# Patient Record
Sex: Male | Born: 1940 | Race: White | Hispanic: No | Marital: Married
Health system: Southern US, Community
[De-identification: ages and names within clinical notes are randomized; demographics above are authoritative.]

---

## 2016-09-21 ENCOUNTER — Ambulatory Visit: Payer: Medicare Other | Admitting: Speech Pathology

## 2016-09-26 ENCOUNTER — Encounter: Payer: Medicare Other | Admitting: Speech Pathology

## 2016-09-28 ENCOUNTER — Encounter: Payer: Medicare Other | Admitting: Speech Pathology

## 2016-10-01 ENCOUNTER — Encounter: Payer: Medicare Other | Admitting: Speech Pathology

## 2016-10-03 ENCOUNTER — Encounter: Payer: Medicare Other | Admitting: Speech Pathology

## 2016-10-08 ENCOUNTER — Encounter: Payer: Medicare Other | Admitting: Speech Pathology

## 2016-10-10 ENCOUNTER — Encounter: Payer: Medicare Other | Admitting: Speech Pathology

## 2016-10-16 ENCOUNTER — Encounter: Payer: Medicare Other | Admitting: Speech Pathology

## 2016-10-22 ENCOUNTER — Encounter: Payer: Medicare Other | Admitting: Speech Pathology

## 2016-10-24 ENCOUNTER — Encounter: Payer: Medicare Other | Admitting: Speech Pathology

## 2016-10-29 ENCOUNTER — Encounter: Payer: Medicare Other | Admitting: Speech Pathology

## 2016-10-31 ENCOUNTER — Encounter: Payer: Medicare Other | Admitting: Speech Pathology

## 2016-11-05 ENCOUNTER — Encounter: Payer: Medicare Other | Admitting: Speech Pathology

## 2016-11-07 ENCOUNTER — Encounter: Payer: Medicare Other | Admitting: Speech Pathology

## 2020-06-20 ENCOUNTER — Other Ambulatory Visit: Payer: Self-pay

## 2020-06-20 ENCOUNTER — Ambulatory Visit: Payer: Medicare PPO | Admitting: Dermatology

## 2020-06-20 DIAGNOSIS — Z1283 Encounter for screening for malignant neoplasm of skin: Secondary | ICD-10-CM | POA: Diagnosis not present

## 2020-06-20 DIAGNOSIS — Z872 Personal history of diseases of the skin and subcutaneous tissue: Secondary | ICD-10-CM

## 2020-06-20 DIAGNOSIS — L219 Seborrheic dermatitis, unspecified: Secondary | ICD-10-CM | POA: Diagnosis not present

## 2020-06-20 DIAGNOSIS — L821 Other seborrheic keratosis: Secondary | ICD-10-CM

## 2020-06-20 DIAGNOSIS — S30861A Insect bite (nonvenomous) of abdominal wall, initial encounter: Secondary | ICD-10-CM

## 2020-06-20 DIAGNOSIS — D1800 Hemangioma unspecified site: Secondary | ICD-10-CM

## 2020-06-20 DIAGNOSIS — L304 Erythema intertrigo: Secondary | ICD-10-CM

## 2020-06-20 DIAGNOSIS — L820 Inflamed seborrheic keratosis: Secondary | ICD-10-CM | POA: Diagnosis not present

## 2020-06-20 DIAGNOSIS — L814 Other melanin hyperpigmentation: Secondary | ICD-10-CM

## 2020-06-20 DIAGNOSIS — W57XXXA Bitten or stung by nonvenomous insect and other nonvenomous arthropods, initial encounter: Secondary | ICD-10-CM

## 2020-06-20 DIAGNOSIS — D229 Melanocytic nevi, unspecified: Secondary | ICD-10-CM

## 2020-06-20 DIAGNOSIS — L578 Other skin changes due to chronic exposure to nonionizing radiation: Secondary | ICD-10-CM

## 2020-06-20 MED ORDER — MOMETASONE FUROATE 0.1 % EX OINT: TOPICAL_OINTMENT | CUTANEOUS | 0 refills | Status: AC

## 2020-06-20 NOTE — Progress Notes (Signed)
   Follow-Up Visit   Subjective  Donald Fowler is a 79 y.o. male who presents for the following: Annual Exam (Total body skin exam, hx AKs) and hx tick bite (R abdomen, ~24m, no symptoms). The patient presents for Total-Body Skin Exam (TBSE) for skin cancer screening and mole check.  The following portions of the chart were reviewed this encounter and updated as appropriate:  Allergies  Meds  Problems  Med Hx  Surg Hx  Fam Hx     Review of Systems:  No other skin or systemic complaints except as noted in HPI or Assessment and Plan.  Objective  Well appearing patient in no apparent distress; mood and affect are within normal limits.  A full examination was performed including scalp, head, eyes, ears, nose, lips, neck, chest, axillae, abdomen, back, buttocks, bilateral upper extremities, bilateral lower extremities, hands, feet, fingers, toes, fingernails, and toenails. All findings within normal limits unless otherwise noted below.  Objective  bil ears: Pink patches with greasy scale.   Objective  Face, scalp: Face/scalp clear today  Objective  bil shoulders: Erythematous keratotic or waxy stuck-on papule or plaque.    Assessment & Plan    Lentigines - Scattered tan macules - Discussed due to sun exposure - Benign, observe - Call for any changes  Seborrheic Keratoses - Stuck-on, waxy, tan-brown papules and plaques  - Discussed benign etiology and prognosis. - Observe - Call for any changes  Melanocytic Nevi - Tan-brown and/or pink-flesh-colored symmetric macules and papules - Benign appearing on exam today - Observation - Call clinic for new or changing moles - Recommend daily use of broad spectrum spf 30+ sunscreen to sun-exposed areas.   Hemangiomas - Red papules - Discussed benign nature - Observe - Call for any changes  Actinic Damage - diffuse scaly erythematous macules with underlying dyspigmentation - Recommend daily broad spectrum sunscreen SPF  30+ to sun-exposed areas, reapply every 2 hours as needed.  - Call for new or changing lesions.  Skin cancer screening performed today.  Seborrheic dermatitis bil ears 1% hydrocortisone prn Benign, observe  Intertrigo groin Pt currently using Ketoconazole 2% cream and is seeing urologist  Bug bite without infection, initial encounter Right Abdomen (side) - Lower Be aware of symptoms of rash headache nausea vomiting diarrhea or malaise or muscle aches and contact his PCP or [place] due to potential for recommended spotted fever and Lyme disease. Hx of Tick bite ~69m Start Mometasone oint qd/bid until clear  mometasone (ELOCON) 0.1 % ointment - Right Abdomen (side) - Lower  History of actinic keratoses Face, scalp Clear today, observe  Inflamed seborrheic keratosis bil shoulders Pt declined Ln2 treatment  Return in about 1 year (around 06/20/2021) for TBSE.  I, Othelia Pulling, RMA, am acting as scribe for Sarina Ser, MD .  Documentation: I have reviewed the above documentation for accuracy and completeness, and I agree with the above.  Sarina Ser, MD

## 2020-06-24 ENCOUNTER — Encounter: Payer: Self-pay | Admitting: Dermatology

## 2021-04-18 ENCOUNTER — Other Ambulatory Visit: Payer: Self-pay | Admitting: Obstetrics and Gynecology

## 2021-04-18 ENCOUNTER — Other Ambulatory Visit (HOSPITAL_COMMUNITY): Payer: Self-pay | Admitting: Obstetrics and Gynecology

## 2021-04-18 DIAGNOSIS — I6782 Cerebral ischemia: Secondary | ICD-10-CM

## 2021-04-18 DIAGNOSIS — R42 Dizziness and giddiness: Secondary | ICD-10-CM

## 2021-04-22 ENCOUNTER — Other Ambulatory Visit: Payer: Self-pay

## 2021-04-22 ENCOUNTER — Ambulatory Visit
Admission: RE | Admit: 2021-04-22 | Discharge: 2021-04-22 | Disposition: A | Discharge status: Home / Self Care | Payer: Medicare PPO | Source: Ambulatory Visit | Attending: Obstetrics and Gynecology | Admitting: Obstetrics and Gynecology

## 2021-04-22 DIAGNOSIS — R42 Dizziness and giddiness: Secondary | ICD-10-CM | POA: Insufficient documentation

## 2021-04-22 DIAGNOSIS — I6782 Cerebral ischemia: Secondary | ICD-10-CM | POA: Diagnosis present

## 2021-04-22 IMAGING — MR MR HEAD WO/W CM
15 series · 48 of 48 positions shown · IV contrast (gadavist)
Comparison: MRA of the neck [DATE]

CLINICAL DATA: Stroke 2 months ago. Residual left-sided deficit.

EXAM:
MRI HEAD WITHOUT AND WITH CONTRAST
TECHNIQUE: Multiplanar, multiecho pulse sequences of the brain and surrounding
structures were obtained without and with intravenous contrast.
CONTRAST:  10mL GADAVIST GADOBUTROL 1 MMOL/ML IV SOLN

[Series 5: ax dwi_tracew · axial · B · 3.0mm · 0.71mm/px · z∈[-80,+84]mm · 5 of 112 slices shown]
[im 1/112]
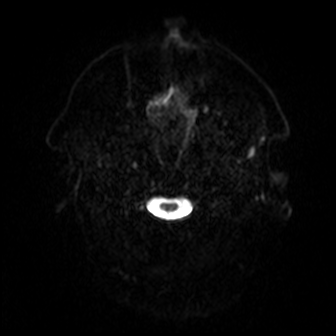
[im 28/112]
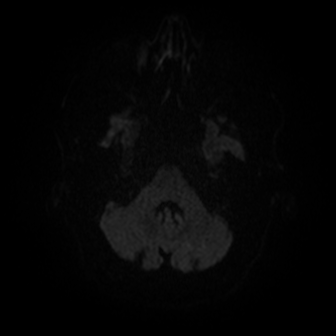
[im 56/112]
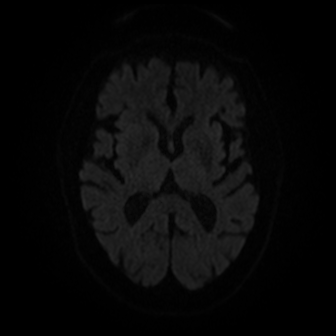
[im 84/112]
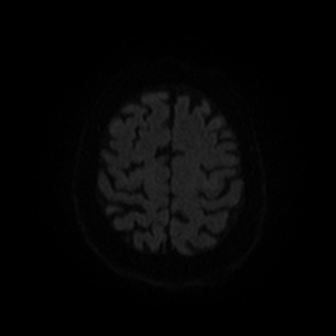
[im 112/112]
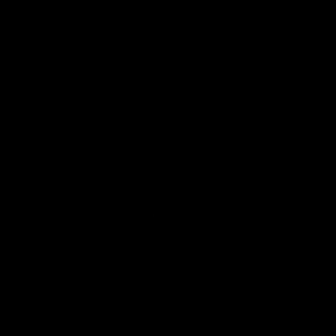

[Series 6: ax dwi_adc · axial · B · 3.0mm · 0.71mm/px · z∈[-80,+79]mm · 3 of 54 slices shown]
[im 1/54]
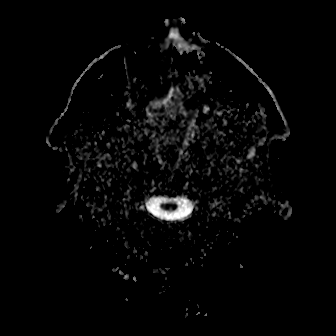
[im 27/54]
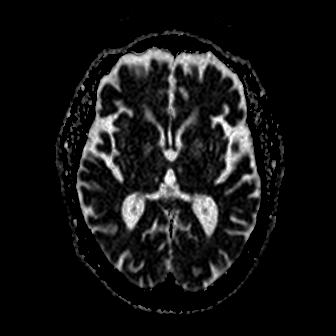
[im 54/54]
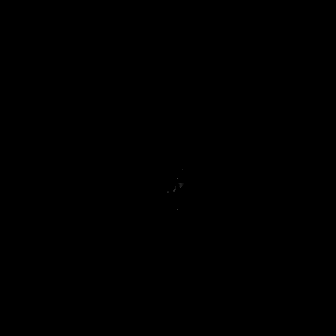

[Series 7: cor dwi_tracew · coronal · B · 5.0mm · 0.68mm/px · 4 of 80 slices shown]
[im 1/80]
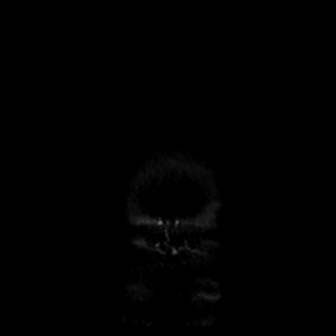
[im 27/80]
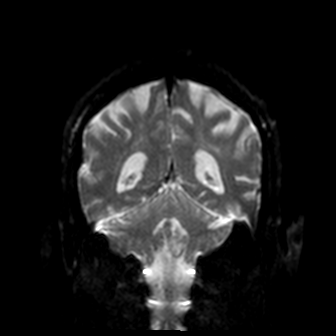
[im 53/80]
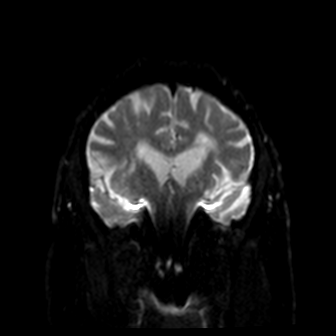
[im 80/80]
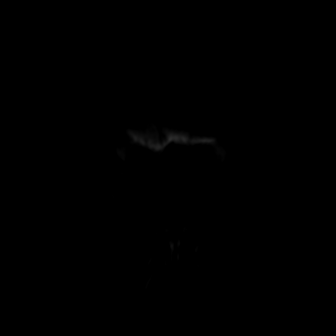

[Series 8: cor dwi_adc · coronal · B · 5.0mm · 0.68mm/px · 2 of 40 slices shown]
[im 1/40]
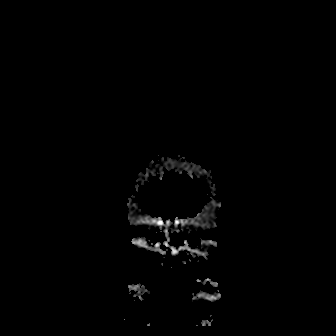
[im 40/40]
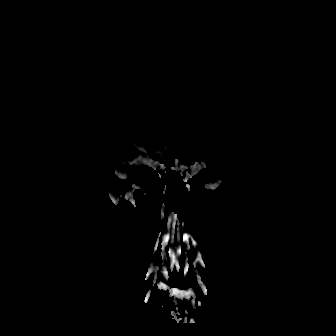

[Series 9: T1 · sagittal · B · 5.0mm · 0.62mm/px · 1 of 25 slices shown (1 of 2)]
[im 1/25]
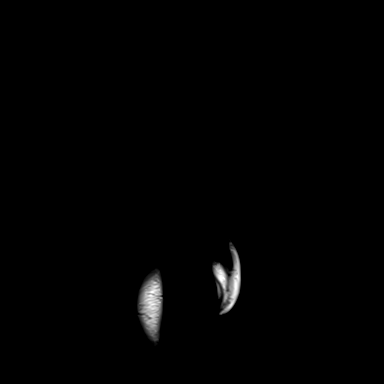

[Series 10: T2 · axial · B · 5.0mm · 0.53mm/px · 1 of 25 slices shown (1 of 2)]
[im 1/25]
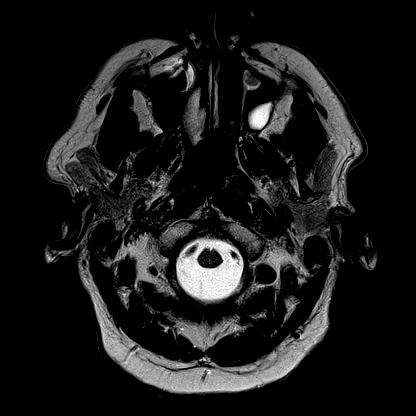

[Series 11: mag_images · axial · B · 3.0mm · 0.90mm/px · z∈[-86,+91]mm · 3 of 60 slices shown]
[im 1/60]
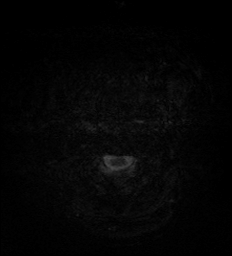
[im 30/60]
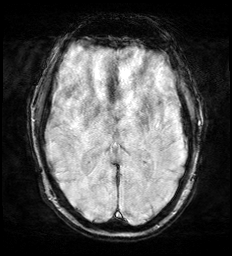
[im 60/60]
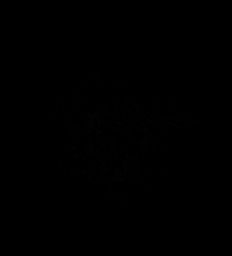

[Series 12: pha_images · axial · B · 3.0mm · 0.90mm/px · z∈[-86,+91]mm · 3 of 59 slices shown]
[im 1/59]
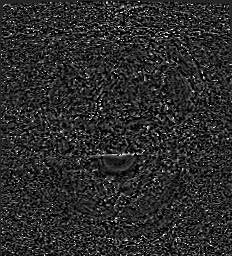
[im 30/59]
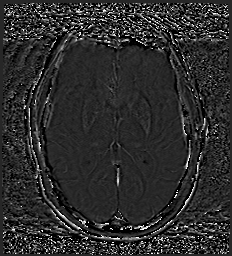
[im 59/59]
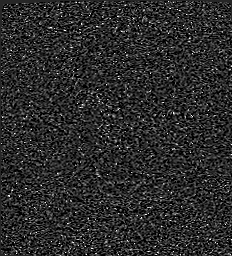

[Series 13: swi_images · axial · B · 3.0mm · 0.90mm/px · z∈[-86,+91]mm · 3 of 60 slices shown]
[im 1/60]
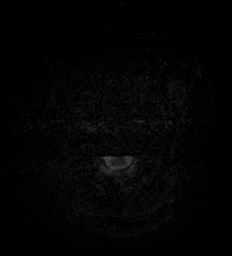
[im 30/60]
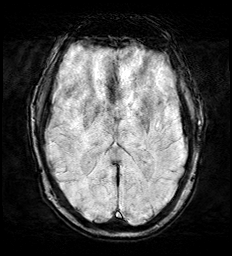
[im 60/60]
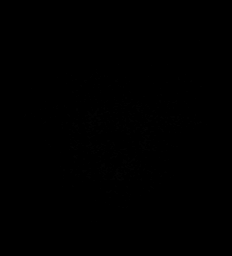

[Series 14: mip_images(sw) · axial · B · 24.0mm · 0.90mm/px · z∈[-75,+80]mm · 2 of 53 slices shown]
[im 1/53]
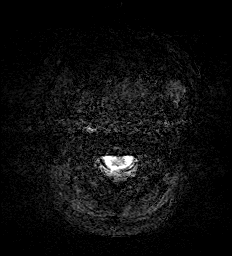
[im 53/53]
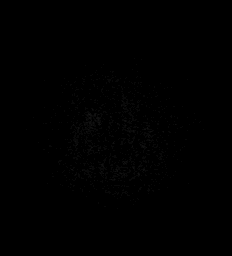

[Series 15: FLAIR · axial · B · 3.0mm · 0.53mm/px · z∈[-79,+83]mm · 3 of 55 slices shown]
[im 1/55]
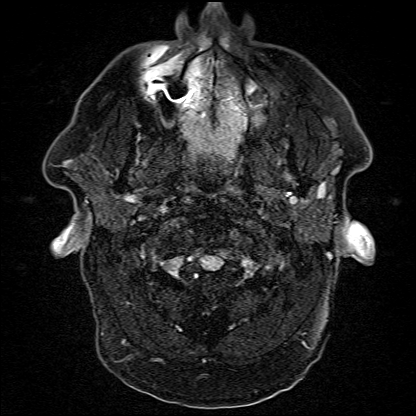
[im 28/55]
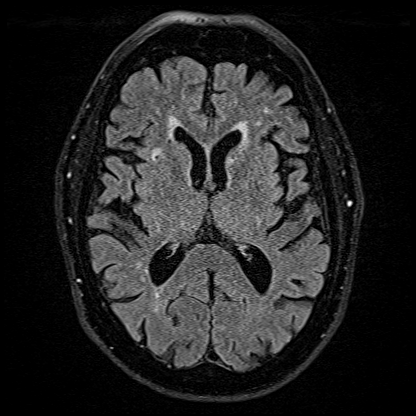
[im 55/55]
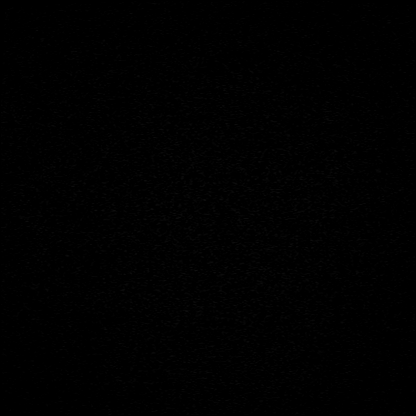

[Series 16: T1 · axial · B · 1.0mm · 0.98mm/px · z∈[-85,+90]mm · 8 of 175 slices shown (2 of 2)]
[im 1/175]
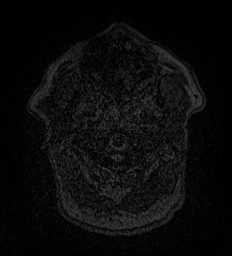
[im 25/175]
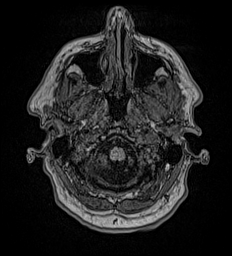
[im 50/175]
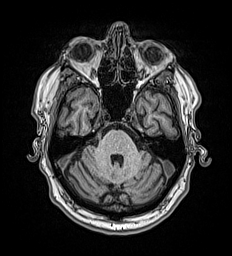
[im 75/175]
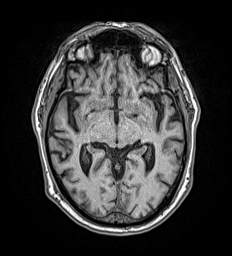
[im 100/175]
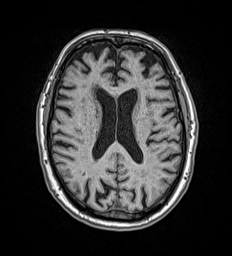
[im 125/175]
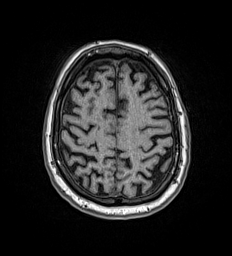
[im 150/175]
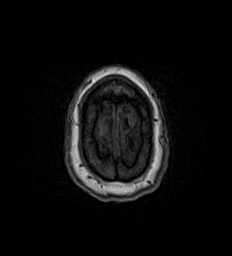
[im 175/175]
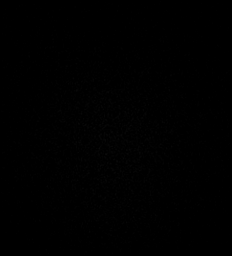

[Series 17: T2 · coronal · B · 5.0mm · 0.57mm/px · 1 of 29 slices shown (2 of 2)]
[im 1/29]
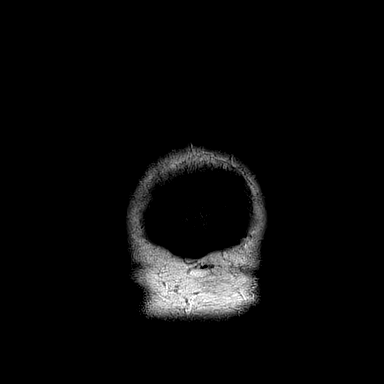

[Series 18: T1 post-contrast · axial · B · 1.0mm · 0.98mm/px · z∈[-85,+89]mm · 8 of 174 slices shown (1 of 2)]
[im 1/174]
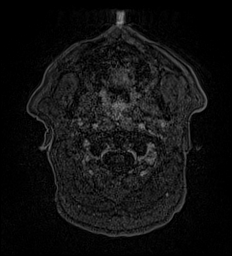
[im 25/174]
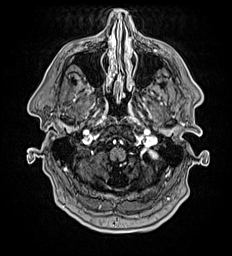
[im 50/174]
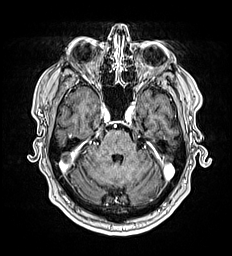
[im 75/174]
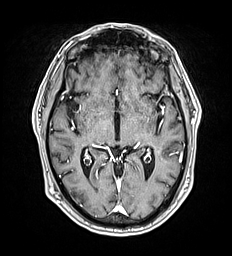
[im 99/174]
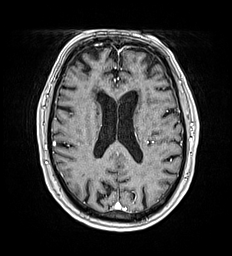
[im 124/174]
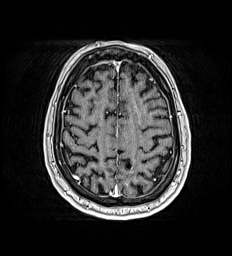
[im 149/174]
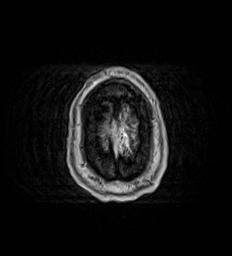
[im 174/174]
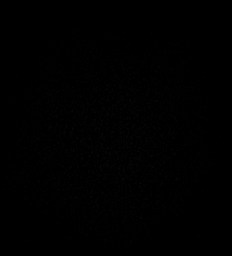

[Series 19: T1 post-contrast · coronal · B · 5.0mm · 0.57mm/px · 1 of 29 slices shown (2 of 2)]
[im 1/29]
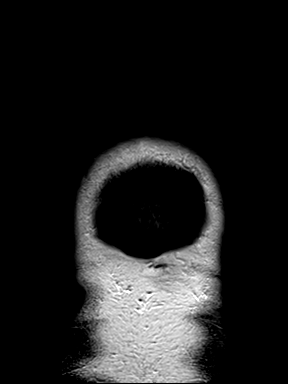

[48 of 48 positions shown; findings below may reference images not displayed]

FINDINGS: Brain: The diffusion-weighted images demonstrate no acute or
subacute infarction. Remote lacunar infarcts are present in the
thalami bilaterally. Dilated perivascular spaces are present
throughout the basal ganglia. Remote lacunar infarcts are present in
the subcortical and periventricular left frontal lobe. Moderate
diffuse white matter disease is present bilaterally. White matter
changes extend into the brainstem. Remote lacunar infarcts are
present in the left cerebellum.

No acute infarct, hemorrhage, or mass lesion is present. The
ventricles are of normal size. No significant extraaxial fluid
collection is present.

The internal auditory canals are within normal limits.

Postcontrast images demonstrate no pathologic enhancement.

Vascular: Flow is present in the major intracranial arteries.

Skull and upper cervical spine: The craniocervical junction is
normal. Upper cervical spine is within normal limits. Marrow signal
is unremarkable.

Sinuses/Orbits: Polyps or mucous retention cysts are present in the
left maxillary sinus. The paranasal sinuses and mastoid air cells
are otherwise clear.
IMPRESSION: 1. No acute or subacute infarct.
2. Remote lacunar infarcts of the thalami bilaterally and left
cerebellum.
3. Remote lacunar infarcts of the left frontal lobe periventricular
and subcortical white matter.
4. Remote lacunar infarcts of the left cerebellum.
5. Moderate diffuse white matter disease likely reflects the sequela
of chronic microvascular ischemia.

## 2021-04-22 MED ORDER — GADOBUTROL 1 MMOL/ML IV SOLN
10.0000 mL | Freq: Once | INTRAVENOUS | Status: AC | PRN
  Administered 2021-04-22: 10 mL via INTRAVENOUS

## 2021-06-28 ENCOUNTER — Encounter: Payer: Medicare PPO | Admitting: Dermatology

## 2021-08-09 ENCOUNTER — Other Ambulatory Visit: Payer: Self-pay

## 2021-08-09 ENCOUNTER — Ambulatory Visit: Payer: Medicare PPO | Admitting: Dermatology

## 2021-08-09 DIAGNOSIS — Z1283 Encounter for screening for malignant neoplasm of skin: Secondary | ICD-10-CM

## 2021-08-09 DIAGNOSIS — D1800 Hemangioma unspecified site: Secondary | ICD-10-CM

## 2021-08-09 DIAGNOSIS — L820 Inflamed seborrheic keratosis: Secondary | ICD-10-CM | POA: Diagnosis not present

## 2021-08-09 DIAGNOSIS — C44212 Basal cell carcinoma of skin of right ear and external auricular canal: Secondary | ICD-10-CM | POA: Diagnosis not present

## 2021-08-09 DIAGNOSIS — B353 Tinea pedis: Secondary | ICD-10-CM

## 2021-08-09 DIAGNOSIS — L578 Other skin changes due to chronic exposure to nonionizing radiation: Secondary | ICD-10-CM | POA: Diagnosis not present

## 2021-08-09 DIAGNOSIS — C44311 Basal cell carcinoma of skin of nose: Secondary | ICD-10-CM

## 2021-08-09 DIAGNOSIS — D229 Melanocytic nevi, unspecified: Secondary | ICD-10-CM

## 2021-08-09 DIAGNOSIS — L821 Other seborrheic keratosis: Secondary | ICD-10-CM

## 2021-08-09 DIAGNOSIS — Z86018 Personal history of other benign neoplasm: Secondary | ICD-10-CM

## 2021-08-09 DIAGNOSIS — D485 Neoplasm of uncertain behavior of skin: Secondary | ICD-10-CM

## 2021-08-09 DIAGNOSIS — C4491 Basal cell carcinoma of skin, unspecified: Secondary | ICD-10-CM

## 2021-08-09 DIAGNOSIS — L814 Other melanin hyperpigmentation: Secondary | ICD-10-CM

## 2021-08-09 HISTORY — DX: Basal cell carcinoma of skin, unspecified: C44.91

## 2021-08-09 MED ORDER — KETOCONAZOLE 2 % EX CREA: TOPICAL_CREAM | CUTANEOUS | 11 refills | Status: DC

## 2021-08-09 NOTE — Patient Instructions (Signed)
Electrodesiccation and Curettage ("Scrape and Burn") Wound Care Instructions  Leave the original bandage on for 24 hours if possible.  If the bandage becomes soaked or soiled before that time, it is OK to remove it and examine the wound.  A small amount of post-operative bleeding is normal.  If excessive bleeding occurs, remove the bandage, place gauze over the site and apply continuous pressure (no peeking) over the area for 30 minutes. If this does not work, please call our clinic as soon as possible or page your doctor if it is after hours.   Once a day, cleanse the wound with soap and water. It is fine to shower. If a thick crust develops you may use a Q-tip dipped into dilute hydrogen peroxide (mix 1:1 with water) to dissolve it.  Hydrogen peroxide can slow the healing process, so use it only as needed.    After washing, apply petroleum jelly (Vaseline) or an antibiotic ointment if your doctor prescribed one for you, followed by a bandage.    For best healing, the wound should be covered with a layer of ointment at all times. If you are not able to keep the area covered with a bandage to hold the ointment in place, this may mean re-applying the ointment several times a day.  Continue this wound care until the wound has healed and is no longer open. It may take several weeks for the wound to heal and close.  Itching and mild discomfort is normal during the healing process.  If you have any discomfort, you can take Tylenol (acetaminophen) or ibuprofen as directed on the bottle. (Please do not take these if you have an allergy to them or cannot take them for another reason).  Some redness, tenderness and white or yellow material in the wound is normal healing.  If the area becomes very sore and red, or develops a thick yellow-green material (pus), it may be infected; please notify us.    Wound healing continues for up to one year following surgery. It is not unusual to experience pain in the scar  from time to time during the interval.  If the pain becomes severe or the scar thickens, you should notify the office.    A slight amount of redness in a scar is expected for the first six months.  After six months, the redness will fade and the scar will soften and fade.  The color difference becomes less noticeable with time.  If there are any problems, return for a post-op surgery check at your earliest convenience.  To improve the appearance of the scar, you can use silicone scar gel, cream, or sheets (such as Mederma or Serica) every night for up to one year. These are available over the counter (without a prescription).  Please call our office at [phone] for any questions or concerns.   Wound Care Instructions  Cleanse wound gently with soap and water once a day then pat dry with clean gauze. Apply a thing coat of Petrolatum (petroleum jelly, "Vaseline") over the wound (unless you have an allergy to this). We recommend that you use a new, sterile tube of Vaseline. Do not pick or remove scabs. Do not remove the yellow or white "healing tissue" from the base of the wound.  Cover the wound with fresh, clean, nonstick gauze and secure with paper tape. You may use Band-Aids in place of gauze and tape if the would is small enough, but would recommend trimming much of the tape  off as there is often too much. Sometimes Band-Aids can irritate the skin.  You should call the office for your biopsy report after 1 week if you have not already been contacted.  If you experience any problems, such as abnormal amounts of bleeding, swelling, significant bruising, significant pain, or evidence of infection, please call the office immediately.  FOR ADULT SURGERY PATIENTS: If you need something for pain relief you may take 1 extra strength Tylenol (acetaminophen) AND 2 Ibuprofen ('200mg'$  each) together every 4 hours as needed for pain. (do not take these if you are allergic to them or if you have a reason  you should not take them.) Typically, you may only need pain medication for 1 to 3 days.

## 2021-08-09 NOTE — Progress Notes (Signed)
Follow-Up Visit   Subjective  Donald Fowler is a 80 y.o. male who presents for the following: TBSE (Total body exam today. No hx of skin cancer or dysplastic nevi. Pt has hx of AKs. He has spots to check on his right ear, nose, and scalp. ). Patient here for full body skin exam and skin cancer screening.  The following portions of the chart were reviewed this encounter and updated as appropriate:  Allergies  Meds  Problems  Med Hx  Surg Hx  Fam Hx     Review of Systems: No other skin or systemic complaints except as noted in HPI or Assessment and Plan Objective  Well appearing patient in no apparent distress; mood and affect are within normal limits.  A full examination was performed including scalp, head, eyes, ears, nose, lips, neck, chest, axillae, abdomen, back, buttocks, bilateral upper extremities, bilateral lower extremities, hands, feet, fingers, toes, fingernails, and toenails. All findings within normal limits unless otherwise noted below.  left nasal ala 1 cm      Right Ear superior helix 0.6 cm crusted macule        left temple within hairline Erythematous keratotic or waxy stuck-on papule or plaque.   Right Foot - Anterior Scaling and maceration web spaces and over distal and lateral soles.   Assessment & Plan  Neoplasm of uncertain behavior of skin (2) left nasal ala Epidermal / dermal shaving  Lesion diameter (cm):  1 Informed consent: discussed and consent obtained   Timeout: patient name, date of birth, surgical site, and procedure verified   Procedure prep:  Patient was prepped and draped in usual sterile fashion Prep type:  Isopropyl alcohol Anesthesia: the lesion was anesthetized in a standard fashion   Anesthetic:  1% lidocaine w/ epinephrine 1-100,000 buffered w/ 8.4% NaHCO3 Instrument used: flexible razor blade   Hemostasis achieved with: pressure, aluminum chloride and electrodesiccation   Outcome: patient tolerated procedure well    Post-procedure details: sterile dressing applied and wound care instructions given   Dressing type: bandage and petrolatum    Destruction of lesion Complexity: extensive   Destruction method: electrodesiccation and curettage   Informed consent: discussed and consent obtained   Timeout:  patient name, date of birth, surgical site, and procedure verified Procedure prep:  Patient was prepped and draped in usual sterile fashion Prep type:  Isopropyl alcohol Anesthesia: the lesion was anesthetized in a standard fashion   Anesthetic:  1% lidocaine w/ epinephrine 1-100,000 buffered w/ 8.4% NaHCO3 Curettage performed in three different directions: Yes   Electrodesiccation performed over the curetted area: Yes   Lesion length (cm):  1 Lesion width (cm):  1 Margin per side (cm):  0.2 Final wound size (cm):  1.4 Hemostasis achieved with:  pressure and aluminum chloride Outcome: patient tolerated procedure well with no complications   Post-procedure details: sterile dressing applied and wound care instructions given   Dressing type: bandage and petrolatum    Specimen 2 - Surgical pathology Differential Diagnosis: r/o skin cancer Check Margins: No  Right Ear superior helix Skin / nail biopsy Type of biopsy: tangential   Informed consent: discussed and consent obtained   Timeout: patient name, date of birth, surgical site, and procedure verified   Procedure prep:  Patient was prepped and draped in usual sterile fashion Prep type:  Isopropyl alcohol Anesthesia: the lesion was anesthetized in a standard fashion   Anesthetic:  1% lidocaine w/ epinephrine 1-100,000 buffered w/ 8.4% NaHCO3 Instrument used: flexible razor blade  Hemostasis achieved with: pressure, aluminum chloride and electrodesiccation   Outcome: patient tolerated procedure well   Post-procedure details: sterile dressing applied and wound care instructions given   Dressing type: bandage and petrolatum    Specimen 1 -  Surgical pathology Differential Diagnosis: r/o cancer Check Margins: No  Inflamed seborrheic keratosis left temple within hairline Prior to procedure, discussed risks of blister formation, small wound, skin dyspigmentation, or rare scar following cryotherapy. Recommend Vaseline ointment to treated areas while healing.  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 Apply to b/l feet at bedtime every night.  Related Medications ketoconazole (NIZORAL) 2 % cream Apply to both feet every night at bedtime  Skin cancer screening  Lentigines - Scattered tan macules - Due to sun exposure - Benign-appearing, observe - Recommend daily broad spectrum sunscreen SPF 30+ to sun-exposed areas, reapply every 2 hours as needed. - Call for any changes  Seborrheic Keratoses - Stuck-on, waxy, tan-brown papules and/or plaques  - Benign-appearing - Discussed benign etiology and prognosis. - Observe - Call for any changes  Melanocytic Nevi - Tan-brown and/or pink-flesh-colored symmetric macules and papules - Benign appearing on exam today - Observation - Call clinic for new or changing moles - Recommend daily use of broad spectrum spf 30+ sunscreen to sun-exposed areas.   Hemangiomas - Red papules - Discussed benign nature - Observe - Call for any changes  Actinic Damage - Chronic condition, secondary to cumulative UV/sun exposure - diffuse scaly erythematous macules with underlying  dyspigmentation - Recommend daily broad spectrum sunscreen SPF 30+ to sun-exposed areas, reapply every 2 hours as needed.  - Staying in the shade or wearing long sleeves, sun glasses (UVA+UVB protection) and wide brim hats (4-inch brim around the entire circumference of the hat) are also recommended for sun protection.  - Call for new or changing lesions.  Skin cancer screening performed today.  Return in about 5 months (around 01/09/2022) for TBSE.  IHarriett Sine, CMA, am acting as scribe for Sarina Ser, MD. Documentation: I have reviewed the above documentation for accuracy and completeness, and I agree with the above.  Sarina Ser, MD

## 2021-08-10 ENCOUNTER — Other Ambulatory Visit: Payer: Self-pay

## 2021-08-10 DIAGNOSIS — B353 Tinea pedis: Secondary | ICD-10-CM

## 2021-08-10 NOTE — Progress Notes (Signed)
ERROR

## 2021-08-12 ENCOUNTER — Encounter: Payer: Self-pay | Admitting: Dermatology

## 2021-08-18 ENCOUNTER — Telehealth: Payer: Self-pay

## 2021-08-18 NOTE — Telephone Encounter (Signed)
Spoke to pt's wife and scheduled pt for surgery on 09/26/21 for [facility] of R ear superior helix.  Pt is on Plavix and wife advised that the doctor who prescribed would not want him to d/c plavix at all before surgery.  Advised pt's wife that was fine that he may have a little more bleeding and bruising but we can still do the surgery.Donald Fowler

## 2021-08-18 NOTE — Telephone Encounter (Signed)
-----   Message from Ralene Bathe, MD sent at 08/11/2021 10:01 AM EDT ----- Diagnosis 1. 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 Skin , left nasal ala BASAL CELL CARCINOMA, NODULAR PATTERN, BASE INVOLVED  1 - Cancer - BCC Schedule surgery 2 - Cancer - BCC Already treated Recheck next visit

## 2021-08-18 NOTE — Telephone Encounter (Signed)
Advised pt of bx results.  Advised pt that the [facility] of the L nasal ala was treated at the time of the bx  Advised pt that the [facility] of the R ear superior helix needs to be excised.  Pt said wife needs to schedule surgery and she was not home.  I advised I would try calling later today and if she still was not available to have pt's wife call us on Monday to schedule pt for surgery.Mariana Kaufman

## 2021-08-29 ENCOUNTER — Encounter: Payer: Self-pay | Admitting: Dermatology

## 2021-08-29 ENCOUNTER — Ambulatory Visit: Payer: Medicare PPO | Admitting: Dermatology

## 2021-08-29 ENCOUNTER — Other Ambulatory Visit: Payer: Self-pay

## 2021-08-29 DIAGNOSIS — L820 Inflamed seborrheic keratosis: Secondary | ICD-10-CM

## 2021-08-29 DIAGNOSIS — L570 Actinic keratosis: Secondary | ICD-10-CM

## 2021-08-29 DIAGNOSIS — L578 Other skin changes due to chronic exposure to nonionizing radiation: Secondary | ICD-10-CM | POA: Diagnosis not present

## 2021-08-29 DIAGNOSIS — C44212 Basal cell carcinoma of skin of right ear and external auricular canal: Secondary | ICD-10-CM | POA: Diagnosis not present

## 2021-08-29 NOTE — Patient Instructions (Addendum)
Cryotherapy Aftercare  Wash gently with soap and water everyday.   Apply Vaseline and Band-Aid daily until healed.    Wound Care Instructions  Cleanse wound gently with soap and water once a day then pat dry with clean gauze. Apply a thing coat of Petrolatum (petroleum jelly, "Vaseline") over the wound (unless you have an allergy to this). We recommend that you use a new, sterile tube of Vaseline. Do not pick or remove scabs. Do not remove the yellow or white "healing tissue" from the base of the wound.  Cover the wound with fresh, clean, nonstick gauze and secure with paper tape. You may use Band-Aids in place of gauze and tape if the would is small enough, but would recommend trimming much of the tape off as there is often too much. Sometimes Band-Aids can irritate the skin.  You should call the office for your biopsy report after 1 week if you have not already been contacted.  If you experience any problems, such as abnormal amounts of bleeding, swelling, significant bruising, significant pain, or evidence of infection, please call the office immediately.  FOR ADULT SURGERY PATIENTS: If you need something for pain relief you may take 1 extra strength Tylenol (acetaminophen) AND 2 Ibuprofen (200mg each) together every 4 hours as needed for pain. (do not take these if you are allergic to them or if you have a reason you should not take them.) Typically, you may only need pain medication for 1 to 3 days.     If you have any questions or concerns for your doctor, please call our main line at [phone] and press option 4 to reach your doctor's medical assistant. If no one answers, please leave a voicemail as directed and we will return your call as soon as possible. Messages left after 4 pm will be answered the following business day.   You may also send us a message via MyChart. We typically respond to MyChart messages within 1-2 business days.  For prescription refills, please ask your  pharmacy to contact our office. Our fax number is [phone].  If you have an urgent issue when the clinic is closed that cannot wait until the next business day, you can page your doctor at the number below.    Please note that while we do our best to be available for urgent issues outside of office hours, we are not available 24/7.   If you have an urgent issue and are unable to reach us, you may choose to seek medical care at your doctor's office, retail clinic, urgent care center, or emergency room.  If you have a medical emergency, please immediately call 911 or go to the emergency department.  Pager Numbers  - Dr. Kowalski: [phone]  - Dr. Moye: [phone]  - Dr. Stewart: [phone]  In the event of inclement weather, please call our main line at [phone] for an update on the status of any delays or closures.  Dermatology Medication Tips: Please keep the boxes that topical medications come in in order to help keep track of the instructions about where and how to use these. Pharmacies typically print the medication instructions only on the boxes and not directly on the medication tubes.   If your medication is too expensive, please contact our office at [phone] option 4 or send us a message through MyChart.   We are unable to tell what your co-pay for medications will be in advance as this is different depending on your insurance coverage.   However, we may be able to find a substitute medication at lower cost or fill out paperwork to get insurance to cover a needed medication.   If a prior authorization is required to get your medication covered by your insurance company, please allow us 1-2 business days to complete this process.  Drug prices often vary depending on where the prescription is filled and some pharmacies may offer cheaper prices.  The website www.goodrx.com contains coupons for medications through different pharmacies. The prices here do not  account for what the cost may be with help from insurance (it may be cheaper with your insurance), but the website can give you the price if you did not use any insurance.  - You can print the associated coupon and take it with your prescription to the pharmacy.  - You may also stop by our office during regular business hours and pick up a GoodRx coupon card.  - If you need your prescription sent electronically to a different pharmacy, notify our office through [facility] MyChart or by phone at [phone] option 4.  

## 2021-08-29 NOTE — Progress Notes (Signed)
Follow-Up Visit   Subjective  Donald Fowler is a 80 y.o. male who presents for the following: Basal Cell Carcinoma (Biopsy proven of right ear sup helix -treat today).  Accompanied by wife   The following portions of the chart were reviewed this encounter and updated as appropriate:   Allergies  Meds  Problems  Med Hx  Surg Hx  Fam Hx      Review of Systems:  No other skin or systemic complaints except as noted in HPI or Assessment and Plan.  Objective  Well appearing patient in no apparent distress; mood and affect are within normal limits.  A focused examination was performed including right ear. Relevant physical exam findings are noted in the Assessment and Plan.  Right Ear sup helix Healing biopsy site  Right Ear mid helix Erythematous thin papules/macules with gritty scale.   Right Ear mid helix Erythematous keratotic or waxy stuck-on papule or plaque.    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 Right Ear sup helix  Destruction of lesion Complexity: extensive   Destruction method: electrodesiccation and curettage   Informed consent: discussed and consent obtained   Timeout:  patient name, date of birth, surgical site, and procedure verified Procedure prep:  Patient was prepped and draped in usual sterile fashion Prep type:  Isopropyl alcohol Anesthesia: the lesion was anesthetized in a standard fashion   Anesthetic:  1% lidocaine w/ epinephrine 1-100,000 buffered w/ 8.4% NaHCO3 Curettage performed in three different directions: Yes   Electrodesiccation performed over the curetted area: Yes   Final wound size (cm):  1.1 Hemostasis achieved with:  pressure and aluminum chloride Outcome: patient tolerated procedure well with no complications   Post-procedure details: sterile dressing applied and wound care instructions given   Dressing type: bandage and petrolatum    Specimen 1 - Surgical pathology Differential Diagnosis: Biopsy  proven BCC Check Margins: No [id number]  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  Destruction of lesion - Right Ear mid helix Complexity: simple   Destruction method: cryotherapy   Informed consent: discussed and consent obtained   Timeout:  patient name, date of birth, surgical site, and procedure verified Lesion destroyed using liquid nitrogen: Yes   Region frozen until ice ball extended beyond lesion: Yes   Outcome: patient tolerated procedure well with no complications   Post-procedure details: wound care instructions given    Inflamed seborrheic keratosis Right Ear mid helix  Destruction of lesion - Right Ear mid helix Complexity: simple   Destruction method: cryotherapy   Informed consent: discussed and consent obtained   Timeout:  patient name, date of birth, surgical site, and procedure verified Lesion destroyed using liquid nitrogen: Yes   Region frozen until ice ball extended beyond lesion: Yes   Outcome: patient tolerated procedure well with no complications   Post-procedure details: wound care instructions given    Actinic Damage - chronic, 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  - Recommend staying in the shade or wearing long sleeves, sun glasses (UVA+UVB protection) and wide brim hats (4-inch brim around the entire circumference of the hat). - Call for new or changing lesions.  Return for Follow up as scheduled and August 2023 with wife.  I, Ashok Cordia, CMA, am acting as scribe for Sarina Ser, MD . Documentation: I  have reviewed the above documentation for accuracy and completeness, and I agree with the above.  Sarina Ser, MD

## 2021-09-25 ENCOUNTER — Telehealth: Payer: Self-pay

## 2021-09-25 NOTE — Telephone Encounter (Signed)
Patient's spouse came in this morning checking follow up appointments. At patient's last surgery appointment you wanted him to follow up as scheduled which is in February and August. I wanted to confirm this with you again? Spouse states he is doing well and surgery site is healing well.

## 2021-09-26 ENCOUNTER — Encounter: Payer: Medicare PPO | Admitting: Dermatology

## 2021-09-26 NOTE — Telephone Encounter (Signed)
Patient advised of information per Dr. Kowalski. aw 

## 2022-01-10 ENCOUNTER — Other Ambulatory Visit: Payer: Self-pay

## 2022-01-10 ENCOUNTER — Ambulatory Visit: Payer: Medicare PPO | Admitting: Dermatology

## 2022-01-10 DIAGNOSIS — Z1283 Encounter for screening for malignant neoplasm of skin: Secondary | ICD-10-CM | POA: Diagnosis not present

## 2022-01-10 DIAGNOSIS — L570 Actinic keratosis: Secondary | ICD-10-CM

## 2022-01-10 DIAGNOSIS — Z85828 Personal history of other malignant neoplasm of skin: Secondary | ICD-10-CM

## 2022-01-10 DIAGNOSIS — B353 Tinea pedis: Secondary | ICD-10-CM

## 2022-01-10 DIAGNOSIS — L578 Other skin changes due to chronic exposure to nonionizing radiation: Secondary | ICD-10-CM

## 2022-01-10 DIAGNOSIS — L821 Other seborrheic keratosis: Secondary | ICD-10-CM

## 2022-01-10 DIAGNOSIS — D1800 Hemangioma unspecified site: Secondary | ICD-10-CM

## 2022-01-10 DIAGNOSIS — D691 Qualitative platelet defects: Secondary | ICD-10-CM | POA: Diagnosis not present

## 2022-01-10 DIAGNOSIS — L918 Other hypertrophic disorders of the skin: Secondary | ICD-10-CM

## 2022-01-10 DIAGNOSIS — D229 Melanocytic nevi, unspecified: Secondary | ICD-10-CM

## 2022-01-10 MED ORDER — KETOCONAZOLE 2 % EX CREA: 1.0000 "application " | TOPICAL_CREAM | Freq: Every day | CUTANEOUS | 6 refills | Status: DC

## 2022-01-10 NOTE — Patient Instructions (Signed)

## 2022-01-10 NOTE — Progress Notes (Signed)
Follow-Up Visit   Subjective  Donald Fowler is a 81 y.o. male who presents for the following: hx of BCC (R ear sup helix, EDC 08/29/21) and Actinic Keratosis (Face, ears). The patient presents for Upper Body Skin Exam (UBSE) for skin cancer screening and mole check.  The patient has spots, moles and lesions to be evaluated, some may be new or changing and the patient has concerns that these could be cancer.   The following portions of the chart were reviewed this encounter and updated as appropriate:   Allergies   Meds   Problems   Med Hx   Surg Hx   Fam Hx      Review of Systems:  No other skin or systemic complaints except as noted in HPI or Assessment and Plan.  Objective  Well appearing patient in no apparent distress; mood and affect are within normal limits.  All skin waist up examined.  R ear sup helix Well healed scar with no evidence of recurrence.   R nose x 1 Pink scaly macules  bil feet Feet not examined today   Assessment & Plan   Purpura - Chronic; persistent and recurrent.  Treatable, but not curable. 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  - Recommend staying in the shade or wearing long sleeves, sun glasses (UVA+UVB protection) and wide brim hats (4-inch brim around the entire circumference of the hat). - Call for new or changing lesions.   Seborrheic Keratoses - Stuck-on, waxy, tan-brown papules and/or plaques  - Benign-appearing -  Discussed benign etiology and prognosis. - Observe - Call for any changes  History of basal cell carcinoma (BCC) R ear sup helix Clear. Observe for recurrence. Call clinic for new or changing lesions.  Recommend regular skin exams, daily broad-spectrum spf 30+ sunscreen use, and photoprotection.    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 Condition is symptomatic/ bothersome to patient. Not currently at goal.  Cont Ketoconazole 2% cr qhs  ketoconazole (NIZORAL) 2 % cream - bil feet Apply 1 application topically daily. Qhs to feet  Skin cancer screening  Hemangiomas - Red papules - Discussed benign nature - Observe - Call for any changes  Melanocytic Nevi - Tan-brown and/or pink-flesh-colored symmetric macules and papules - Benign appearing on exam today - Observation - Call clinic for new or changing moles - Recommend daily use of broad spectrum spf 30+ sunscreen to sun-exposed areas.   Acrochordons (Skin Tags) - Fleshy, skin-colored pedunculated papules -  Benign appearing.  - Observe. - If desired, they can be removed with an in office procedure that is not covered by insurance. - Please call the clinic if you notice any new or changing lesions.   Skin cancer screening performed today.   Return for as scheduled for TBSE, Hx of BCC, Hx of AKs.  I, Othelia Pulling, RMA, am acting as scribe for Sarina Ser, MD  . Documentation: I have reviewed the above documentation for accuracy and completeness, and I agree with the above.  Sarina Ser, MD

## 2022-01-13 ENCOUNTER — Encounter: Payer: Self-pay | Admitting: Dermatology

## 2022-05-07 ENCOUNTER — Other Ambulatory Visit: Payer: Self-pay | Admitting: Otolaryngology

## 2022-05-07 ENCOUNTER — Other Ambulatory Visit (HOSPITAL_COMMUNITY): Payer: Self-pay | Admitting: Otolaryngology

## 2022-05-07 DIAGNOSIS — R42 Dizziness and giddiness: Secondary | ICD-10-CM

## 2022-05-16 ENCOUNTER — Ambulatory Visit
Admission: RE | Admit: 2022-05-16 | Discharge: 2022-05-16 | Disposition: A | Discharge status: Home / Self Care | Payer: Medicare PPO | Source: Ambulatory Visit | Attending: Otolaryngology | Admitting: Otolaryngology

## 2022-05-16 DIAGNOSIS — R42 Dizziness and giddiness: Secondary | ICD-10-CM | POA: Insufficient documentation

## 2022-05-16 IMAGING — MR MR HEAD WO/W CM
14 series · 48 of 48 positions shown · IV contrast (gadavist)
Comparison: Brain MRI [DATE].

CLINICAL DATA: Provided history: Dizziness. Additional history
provided by scanning technologist: Patient reports episodes of
dizziness and balance difficulty.

EXAM:
MRI HEAD WITHOUT AND WITH CONTRAST
TECHNIQUE: Multiplanar, multiecho pulse sequences of the brain and surrounding
structures were obtained without and with intravenous contrast.
CONTRAST:  10mL GADAVIST GADOBUTROL 1 MMOL/ML IV SOLN

[Series 5: ax dwi_tracew · axial · 3.0mm · 0.65mm/px · z∈[-94,+61]mm · 2 of 48 slices shown]
[im 1/48]
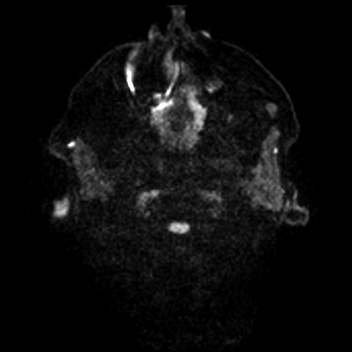
[im 48/48]
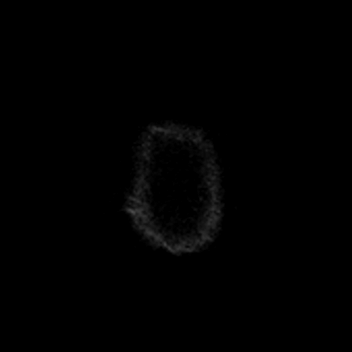

[Series 6: ax dwi_adc · axial · 3.0mm · 0.65mm/px · z∈[-94,+61]mm · 3 of 48 slices shown]
[im 1/48]
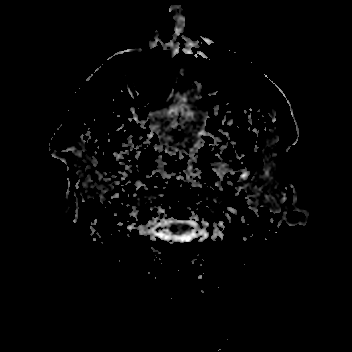
[im 24/48]
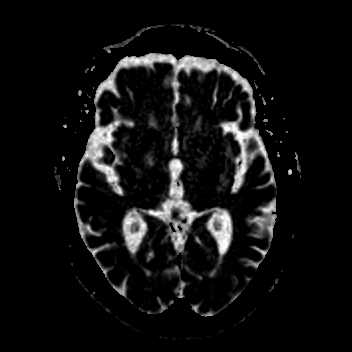
[im 48/48]
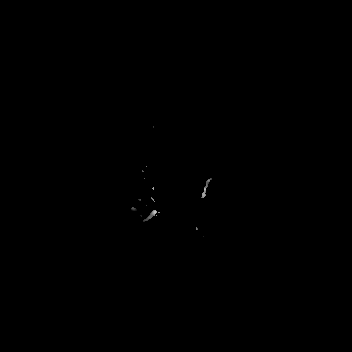

[Series 7: cor dwi_tracew · coronal · 5.0mm · 0.60mm/px · 2 of 38 slices shown]
[im 1/38]
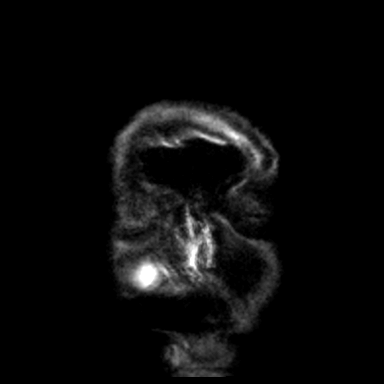
[im 38/38]
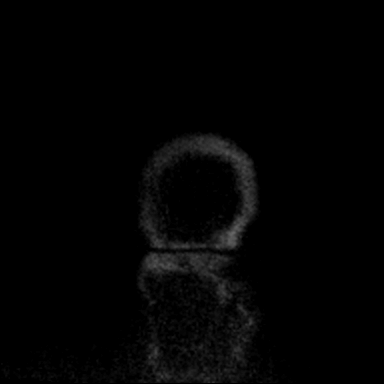

[Series 8: cor dwi_adc · coronal · 5.0mm · 0.60mm/px · 2 of 38 slices shown]
[im 1/38]
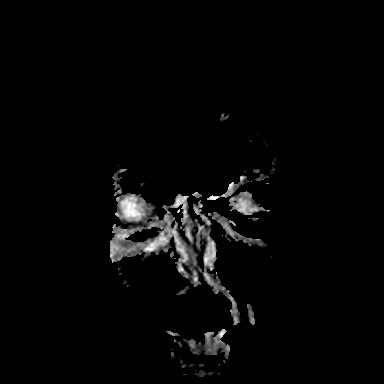
[im 38/38]
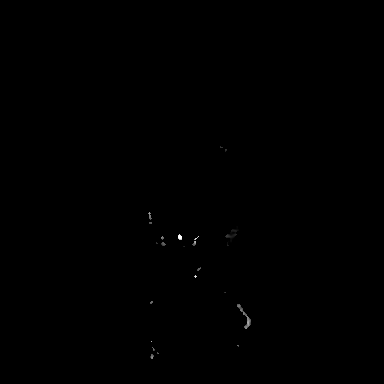

[Series 9: T1 · sagittal · 5.0mm · 0.62mm/px · 1 of 22 slices shown (1 of 2)]
[im 1/22]
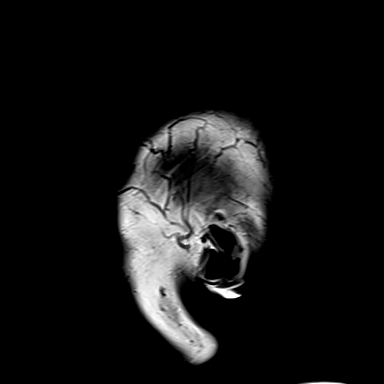

[Series 10: T2 · axial · 5.0mm · 0.55mm/px · 1 of 25 slices shown]
[im 1/25]
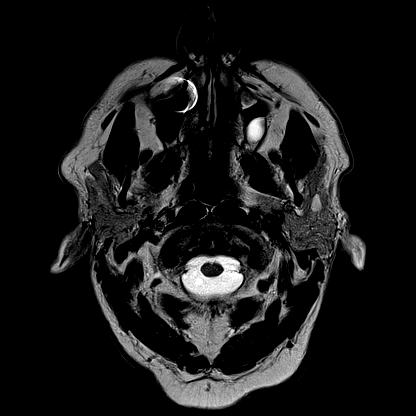

[Series 11: ax swi_mag · axial · 2.0mm · 0.90mm/px · z∈[-95,+63]mm · 4 of 80 slices shown]
[im 1/80]
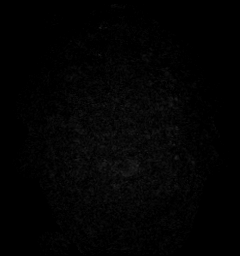
[im 27/80]
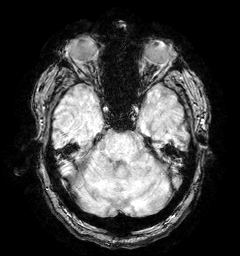
[im 53/80]
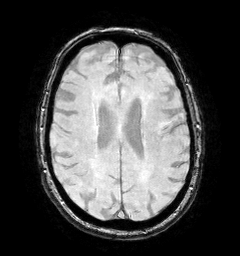
[im 80/80]
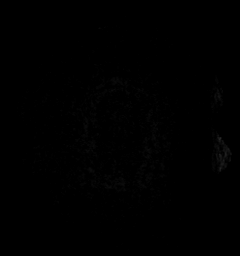

[Series 12: ax swi_pha · axial · 2.0mm · 0.90mm/px · z∈[-95,+63]mm · 4 of 80 slices shown]
[im 1/80]
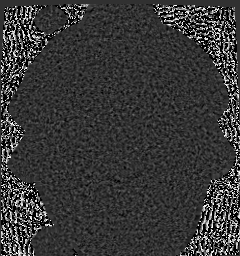
[im 27/80]
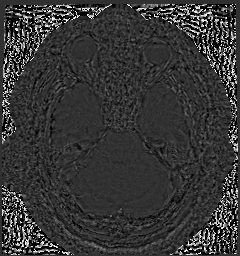
[im 53/80]
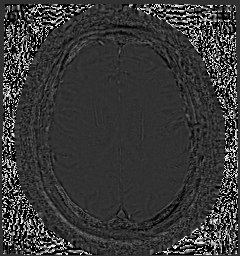
[im 80/80]
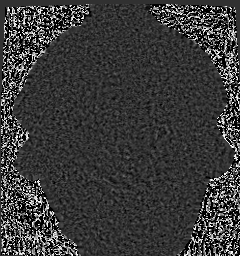

[Series 13: ax swi_swi · axial · 2.0mm · 0.90mm/px · z∈[-95,+63]mm · 4 of 80 slices shown]
[im 1/80]
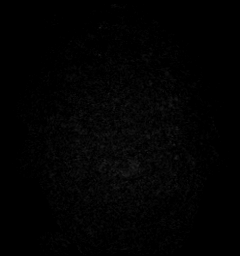
[im 27/80]
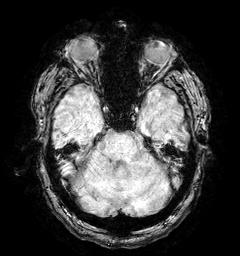
[im 53/80]
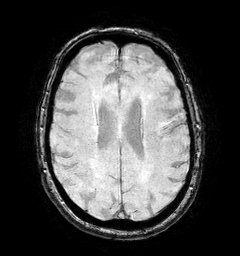
[im 80/80]
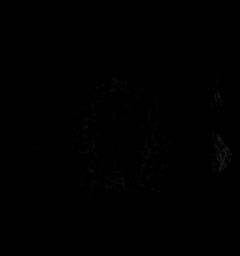

[Series 15: FLAIR · axial · 3.0mm · 0.55mm/px · z∈[-97,+64]mm · 3 of 55 slices shown]
[im 1/55]
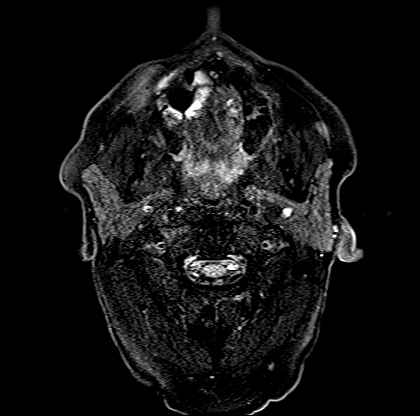
[im 28/55]
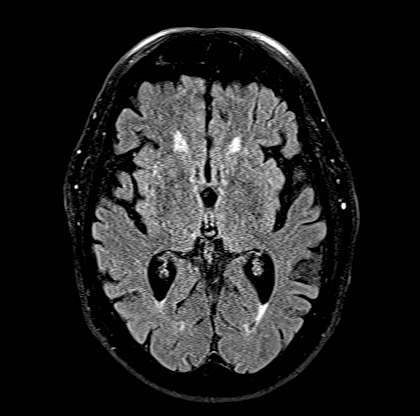
[im 55/55]
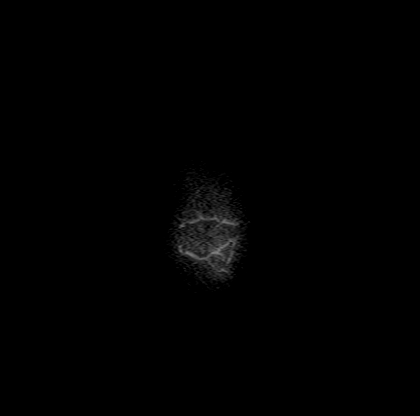

[Series 16: T1 · axial · 1.0mm · 0.98mm/px · z∈[-96,+63]mm · 9 of 160 slices shown (2 of 2)]
[im 1/160]
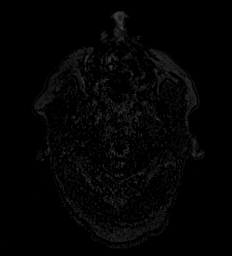
[im 20/160]
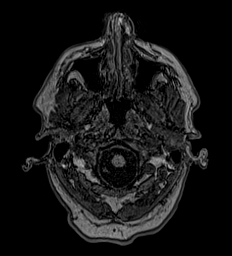
[im 40/160]
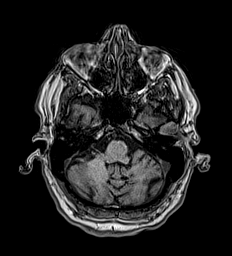
[im 60/160]
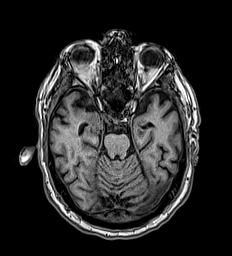
[im 80/160]
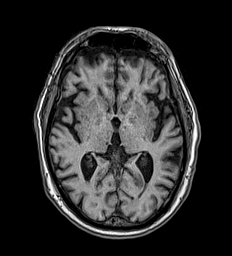
[im 100/160]
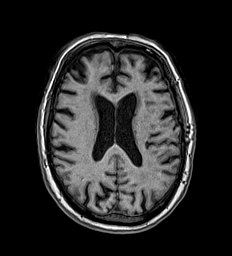
[im 120/160]
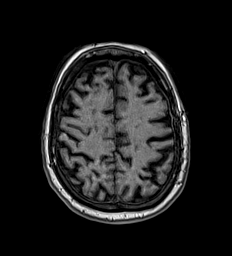
[im 140/160]
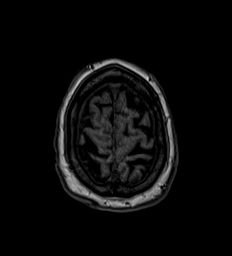
[im 160/160]
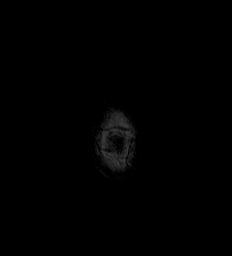

[Series 17: T2 post-contrast · coronal · 5.0mm · 0.57mm/px · 2 of 29 slices shown]
[im 1/29]
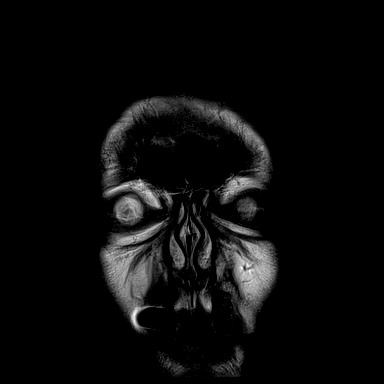
[im 29/29]
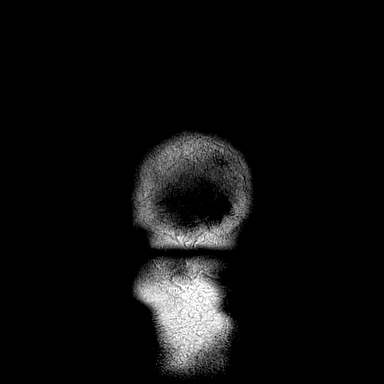

[Series 18: T1 post-contrast · axial · 1.0mm · 0.98mm/px · z∈[-96,+63]mm · 9 of 160 slices shown (1 of 2)]
[im 1/160]
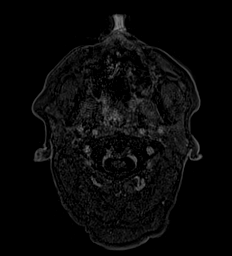
[im 20/160]
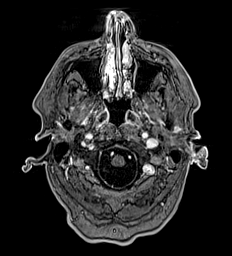
[im 40/160]
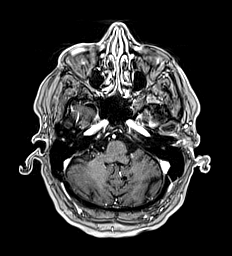
[im 60/160]
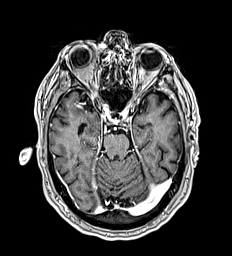
[im 80/160]
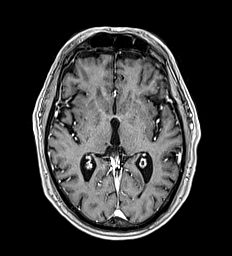
[im 100/160]
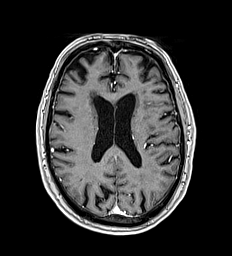
[im 120/160]
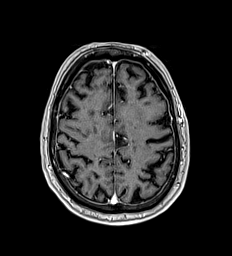
[im 140/160]
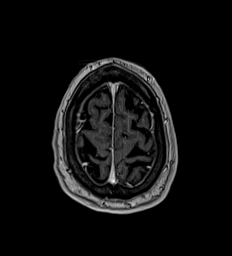
[im 160/160]
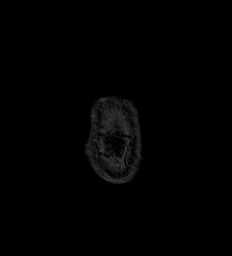

[Series 19: T1 post-contrast · coronal · 5.0mm · 0.57mm/px · 2 of 29 slices shown (2 of 2)]
[im 1/29]
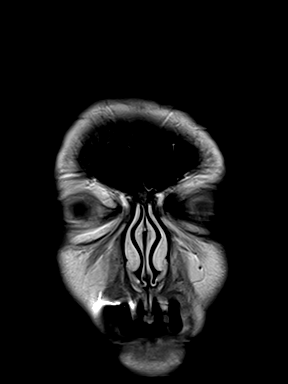
[im 29/29]
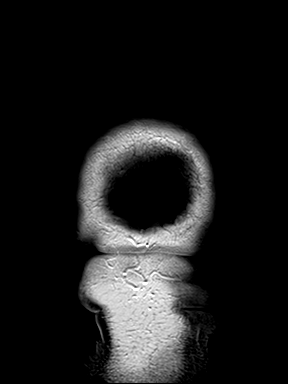

[48 of 48 positions shown; findings below may reference images not displayed]

FINDINGS: Brain:

Mild generalized parenchymal atrophy.

Small chronic cortical/subcortical infarct within the posterolateral
left frontal lobe.

Chronic lacunar infarcts within the bilateral cerebral hemispheric
white matter, within the right lentiform nucleus/external capsule
and within the bilateral thalami.

Background moderate multifocal T2 FLAIR hyperintense signal
abnormality within the cerebral white matter, nonspecific but
compatible with chronic small vessel ischemic disease.

Redemonstrated small chronic infarcts within the bilateral
cerebellar hemispheres.

Few small chronic microhemorrhages within the right frontal lobe
periventricular white matter. Small amount of chronic hemosiderin
deposition also associated with some of the above described chronic
infarcts.

There is no acute infarct.

No evidence of an intracranial mass.

No extra-axial fluid collection.

No midline shift.

No pathologic intracranial enhancement identified.

Vascular: Maintained flow voids within the proximal large arterial
vessels.

Skull and upper cervical spine: No focal suspicious marrow lesion.
Incompletely assessed cervical spondylosis.

Sinuses/Orbits: No mass or acute finding within the imaged orbits.
Mucous retention cysts within the left maxillary sinus measuring up
to 16 mm. Minimal mucosal thickening within the bilateral ethmoid
sinuses.

Other: 8 mm ovoid T2 hyperintense lesion within the left parotid
gland (series 10, image 1).
IMPRESSION: 1. No evidence of acute intracranial abnormality.
2. Stable MRI appearance of the brain as compared to [DATE].
3. Small chronic cortical/subcortical infarct within the
posterolateral left frontal lobe (left MCA vascular territory).
4. Chronic lacunar infarcts within the bilateral cerebral
hemispheric white matter, within the right lentiform
nucleus/external capsule and within the bilateral thalami.
5. Background moderate cerebral white matter chronic small vessel
ischemic disease.
6. Small chronic infarcts within bilateral cerebellar hemispheres.
7. Mild generalized parenchymal atrophy.
8. Paranasal sinus disease, as described.
9. 8 mm ovoid T2 hyperintense lesion within the left parotid gland.
Although nonspecific, this could reflect a small primary parotid
neoplasm. ENT consultation should be considered.

## 2022-05-16 MED ORDER — GADOBUTROL 1 MMOL/ML IV SOLN
10.0000 mL | Freq: Once | INTRAVENOUS | Status: AC | PRN
  Administered 2022-05-16: 10 mL via INTRAVENOUS

## 2022-07-04 ENCOUNTER — Encounter: Payer: Self-pay | Admitting: Dermatology

## 2022-07-04 ENCOUNTER — Ambulatory Visit: Payer: Medicare PPO | Admitting: Dermatology

## 2022-07-04 DIAGNOSIS — D229 Melanocytic nevi, unspecified: Secondary | ICD-10-CM

## 2022-07-04 DIAGNOSIS — L578 Other skin changes due to chronic exposure to nonionizing radiation: Secondary | ICD-10-CM | POA: Diagnosis not present

## 2022-07-04 DIAGNOSIS — B353 Tinea pedis: Secondary | ICD-10-CM | POA: Diagnosis not present

## 2022-07-04 DIAGNOSIS — L814 Other melanin hyperpigmentation: Secondary | ICD-10-CM

## 2022-07-04 DIAGNOSIS — L918 Other hypertrophic disorders of the skin: Secondary | ICD-10-CM

## 2022-07-04 DIAGNOSIS — Z1283 Encounter for screening for malignant neoplasm of skin: Secondary | ICD-10-CM | POA: Diagnosis not present

## 2022-07-04 DIAGNOSIS — D692 Other nonthrombocytopenic purpura: Secondary | ICD-10-CM

## 2022-07-04 DIAGNOSIS — D1800 Hemangioma unspecified site: Secondary | ICD-10-CM

## 2022-07-04 DIAGNOSIS — L821 Other seborrheic keratosis: Secondary | ICD-10-CM

## 2022-07-04 DIAGNOSIS — Z85828 Personal history of other malignant neoplasm of skin: Secondary | ICD-10-CM

## 2022-07-04 MED ORDER — KETOCONAZOLE 2 % EX CREA: 1.0000 | TOPICAL_CREAM | Freq: Every day | CUTANEOUS | 6 refills | Status: DC

## 2022-07-04 NOTE — Patient Instructions (Addendum)
Recommend daily broad spectrum sunscreen SPF 30+ to sun-exposed areas, reapply every 2 hours as needed. Call for new or changing lesions.  Staying in the shade or wearing long sleeves, sun glasses (UVA+UVB protection) and wide brim hats (4-inch brim around the entire circumference of the hat) are also recommended for sun protection.        Due to recent changes in healthcare laws, you may see results of your pathology and/or laboratory studies on MyChart before the doctors have had a chance to review them. We understand that in some cases there may be results that are confusing or concerning to you. Please understand that not all results are received at the same time and often the doctors may need to interpret multiple results in order to provide you with the best plan of care or course of treatment. Therefore, we ask that you please give us 2 business days to thoroughly review all your results before contacting the office for clarification. Should we see a critical lab result, you will be contacted sooner.   If You Need Anything After Your Visit  If you have any questions or concerns for your doctor, please call our main line at [phone] and press option 4 to reach your doctor's medical assistant. If no one answers, please leave a voicemail as directed and we will return your call as soon as possible. Messages left after 4 pm will be answered the following business day.   You may also send us a message via MyChart. We typically respond to MyChart messages within 1-2 business days.  For prescription refills, please ask your pharmacy to contact our office. Our fax number is [phone].  If you have an urgent issue when the clinic is closed that cannot wait until the next business day, you can page your doctor at the number below.    Please note that while we do our best to be available for urgent issues outside of office hours, we are not available 24/7.   If you have an urgent issue and  are unable to reach us, you may choose to seek medical care at your doctor's office, retail clinic, urgent care center, or emergency room.  If you have a medical emergency, please immediately call 911 or go to the emergency department.  Pager Numbers  - Dr. Kowalski: [phone]  - Dr. Moye: [phone]  - Dr. Stewart: [phone]  In the event of inclement weather, please call our main line at [phone] for an update on the status of any delays or closures.  Dermatology Medication Tips: Please keep the boxes that topical medications come in in order to help keep track of the instructions about where and how to use these. Pharmacies typically print the medication instructions only on the boxes and not directly on the medication tubes.   If your medication is too expensive, please contact our office at [phone] option 4 or send us a message through MyChart.   We are unable to tell what your co-pay for medications will be in advance as this is different depending on your insurance coverage. However, we may be able to find a substitute medication at lower cost or fill out paperwork to get insurance to cover a needed medication.   If a prior authorization is required to get your medication covered by your insurance company, please allow us 1-2 business days to complete this process.  Drug prices often vary depending on where the prescription is filled and some pharmacies may offer cheaper prices.    The website www.goodrx.com contains coupons for medications through different pharmacies. The prices here do not account for what the cost may be with help from insurance (it may be cheaper with your insurance), but the website can give you the price if you did not use any insurance.  - You can print the associated coupon and take it with your prescription to the pharmacy.  - You may also stop by our office during regular business hours and pick up a GoodRx coupon card.  - If you need your  prescription sent electronically to a different pharmacy, notify our office through [facility] MyChart or by phone at [phone] option 4.     Si Usted Necesita Algo Despus de Su Visita  Tambin puede enviarnos un mensaje a travs de MyChart. Por lo general respondemos a los mensajes de MyChart en el transcurso de 1 a 2 das hbiles.  Para renovar recetas, por favor pida a su farmacia que se ponga en contacto con nuestra oficina. Nuestro nmero de fax es el [phone].  Si tiene un asunto urgente cuando la clnica est cerrada y que no puede esperar hasta el siguiente da hbil, puede llamar/localizar a su doctor(a) al nmero que aparece a continuacin.   Por favor, tenga en cuenta que aunque hacemos todo lo posible para estar disponibles para asuntos urgentes fuera del horario de oficina, no estamos disponibles las 24 horas del da, los 7 das de la semana.   Si tiene un problema urgente y no puede comunicarse con nosotros, puede optar por buscar atencin mdica  en el consultorio de su doctor(a), en una clnica privada, en un centro de atencin urgente o en una sala de emergencias.  Si tiene una emergencia mdica, por favor llame inmediatamente al 911 o vaya a la sala de emergencias.  Nmeros de bper  - Dr. Kowalski: [phone]  - Dra. Moye: [phone]  - Dra. Stewart: [phone]  En caso de inclemencias del tiempo, por favor llame a nuestra lnea principal al [phone] para una actualizacin sobre el estado de cualquier retraso o cierre.  Consejos para la medicacin en dermatologa: Por favor, guarde las cajas en las que vienen los medicamentos de uso tpico para ayudarle a seguir las instrucciones sobre dnde y cmo usarlos. Las farmacias generalmente imprimen las instrucciones del medicamento slo en las cajas y no directamente en los tubos del medicamento.   Si su medicamento es muy caro, por favor, pngase en contacto con nuestra oficina llamando al [phone]  y presione la opcin 4 o envenos un mensaje a travs de MyChart.   No podemos decirle cul ser su copago por los medicamentos por adelantado ya que esto es diferente dependiendo de la cobertura de su seguro. Sin embargo, es posible que podamos encontrar un medicamento sustituto a menor costo o llenar un formulario para que el seguro cubra el medicamento que se considera necesario.   Si se requiere una autorizacin previa para que su compaa de seguros cubra su medicamento, por favor permtanos de 1 a 2 das hbiles para completar este proceso.  Los precios de los medicamentos varan con frecuencia dependiendo del lugar de dnde se surte la receta y alguna farmacias pueden ofrecer precios ms baratos.  El sitio web www.goodrx.com tiene cupones para medicamentos de diferentes farmacias. Los precios aqu no tienen en cuenta lo que podra costar con la ayuda del seguro (puede ser ms barato con su seguro), pero el sitio web puede darle el precio si no utiliz ningn seguro.  -   Puede imprimir el cupn correspondiente y llevarlo con su receta a la farmacia.  - Tambin puede pasar por nuestra oficina durante el horario de atencin regular y recoger una tarjeta de cupones de GoodRx.  - Si necesita que su receta se enve electrnicamente a una farmacia diferente, informe a nuestra oficina a travs de MyChart de [facility] o por telfono llamando al [phone] y presione la opcin 4.  

## 2022-07-04 NOTE — Progress Notes (Signed)
Follow-Up Visit   Subjective  Donald Fowler is a 81 y.o. male who presents for the following: Annual Exam (Hx BCC, AK's ). The patient presents for Total-Body Skin Exam (TBSE) for skin cancer screening and mole check.  The patient has spots, moles and lesions to be evaluated, some may be new or changing and the patient has concerns that these could be cancer.  The following portions of the chart were reviewed this encounter and updated as appropriate:   Allergies  Meds  Problems  Med Hx  Surg Hx  Fam Hx    Patient accompanied by wife who contributes to history.  Review of Systems:  No other skin or systemic complaints except as noted in HPI or Assessment and Plan.  Objective  Well appearing patient in no apparent distress; mood and affect are within normal limits.  A full examination was performed including scalp, head, eyes, ears, nose, lips, neck, chest, axillae, abdomen, back, buttocks, bilateral upper extremities, bilateral lower extremities, hands, feet, fingers, toes, fingernails, and toenails. All findings within normal limits unless otherwise noted below.  feet Mild scale   Assessment & Plan  Tinea pedis of both feet feet Improved. Resume Ketoconazole cream at bedtime as needed for flares.   Related Medications ketoconazole (NIZORAL) 2 % cream Apply 1 Application topically daily. Qhs to feet  Lentigines - Scattered tan macules - Due to sun exposure - Benign-appearing, observe - Recommend daily broad spectrum sunscreen SPF 30+ to sun-exposed areas, reapply every 2 hours as needed. - Call for any changes  Seborrheic Keratoses - Stuck-on, waxy, tan-brown papules and/or plaques  - Benign-appearing - Discussed benign etiology and prognosis. - Observe - Call for any changes  Melanocytic Nevi - Tan-brown and/or pink-flesh-colored symmetric macules and papules - Benign appearing on exam today - Observation - Call clinic for new or changing moles - Recommend  daily use of broad spectrum spf 30+ sunscreen to sun-exposed areas.   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 - Red papules - Discussed benign nature - Observe - Call for any changes  Actinic Damage - Chronic condition, secondary to cumulative UV/sun exposure - diffuse scaly erythematous macules with underlying dyspigmentation - Recommend daily broad spectrum sunscreen SPF 30+ to sun-exposed areas, reapply every 2 hours as needed.  - Staying in the shade or wearing long sleeves, sun glasses (UVA+UVB protection) and wide brim hats (4-inch brim around the entire circumference of the hat) are also recommended for sun protection.  - Call for new or changing lesions.  History of Basal Cell Carcinoma of the Skin - No evidence of recurrence today - Recommend regular full body skin exams - Recommend daily broad spectrum sunscreen SPF 30+ to sun-exposed areas, reapply every 2 hours as needed.  - Call if any new or changing lesions are noted between office visits  Purpura - Chronic; persistent and recurrent.  Treatable, but not curable. - Violaceous macules and patches - Benign - Related to trauma, age, sun damage and/or use of blood thinners, chronic use of topical and/or oral steroids - Observe - Can use OTC arnica containing moisturizer such as Dermend Bruise Formula if desired - Call for worsening or other concerns  Acrochordons (Skin Tags) - Fleshy, skin-colored pedunculated papules - Benign appearing.  - Observe. - If desired, they can be removed with an in office procedure that is not covered by insurance. - Please call the clinic if you notice any new or changing lesions.  Skin cancer screening performed today.  Return in about 1 year (around 07/05/2023) for TBSE, HxBCC.  I, Emelia Salisbury, CMA, am acting as scribe for Sarina Ser, MD. Documentation: I have reviewed the above documentation for accuracy and completeness, and I agree with the above.  Sarina Ser,  MD

## 2022-07-10 ENCOUNTER — Encounter: Payer: Self-pay | Admitting: Dermatology

## 2023-08-01 ENCOUNTER — Ambulatory Visit: Payer: Medicare PPO | Admitting: Dermatology

## 2023-08-01 ENCOUNTER — Encounter: Payer: Self-pay | Admitting: Dermatology

## 2023-08-01 DIAGNOSIS — Z1283 Encounter for screening for malignant neoplasm of skin: Secondary | ICD-10-CM | POA: Diagnosis not present

## 2023-08-01 DIAGNOSIS — W908XXA Exposure to other nonionizing radiation, initial encounter: Secondary | ICD-10-CM | POA: Diagnosis not present

## 2023-08-01 DIAGNOSIS — L578 Other skin changes due to chronic exposure to nonionizing radiation: Secondary | ICD-10-CM | POA: Diagnosis not present

## 2023-08-01 DIAGNOSIS — L820 Inflamed seborrheic keratosis: Secondary | ICD-10-CM | POA: Diagnosis not present

## 2023-08-01 DIAGNOSIS — B353 Tinea pedis: Secondary | ICD-10-CM

## 2023-08-01 DIAGNOSIS — L570 Actinic keratosis: Secondary | ICD-10-CM

## 2023-08-01 DIAGNOSIS — Z79899 Other long term (current) drug therapy: Secondary | ICD-10-CM

## 2023-08-01 DIAGNOSIS — D692 Other nonthrombocytopenic purpura: Secondary | ICD-10-CM

## 2023-08-01 DIAGNOSIS — L814 Other melanin hyperpigmentation: Secondary | ICD-10-CM

## 2023-08-01 DIAGNOSIS — D1801 Hemangioma of skin and subcutaneous tissue: Secondary | ICD-10-CM

## 2023-08-01 DIAGNOSIS — L821 Other seborrheic keratosis: Secondary | ICD-10-CM

## 2023-08-01 DIAGNOSIS — Z85828 Personal history of other malignant neoplasm of skin: Secondary | ICD-10-CM

## 2023-08-01 DIAGNOSIS — Z7189 Other specified counseling: Secondary | ICD-10-CM

## 2023-08-01 MED ORDER — KETOCONAZOLE 2 % EX CREA: 1.0000 | TOPICAL_CREAM | Freq: Every day | CUTANEOUS | 6 refills | Status: AC

## 2023-08-01 NOTE — Patient Instructions (Addendum)
Seborrheic Keratosis  What causes seborrheic keratoses? Seborrheic keratoses are harmless, common skin growths that first appear during adult life.  As time goes by, more growths appear.  Some people may develop a large number of them.  Seborrheic keratoses appear on both covered and uncovered body parts.  They are not caused by sunlight.  The tendency to develop seborrheic keratoses can be inherited.  They vary in color from skin-colored to gray, brown, or even black.  They can be either smooth or have a rough, warty surface.   Seborrheic keratoses are superficial and look as if they were stuck on the skin.  Under the microscope this type of keratosis looks like layers upon layers of skin.  That is why at times the top layer may seem to fall off, but the rest of the growth remains and re-grows.    Treatment Seborrheic keratoses do not need to be treated, but can easily be removed in the office.  Seborrheic keratoses often cause symptoms when they rub on clothing or jewelry.  Lesions can be in the way of shaving.  If they become inflamed, they can cause itching, soreness, or burning.  Removal of a seborrheic keratosis can be accomplished by freezing, burning, or surgery. If any spot bleeds, scabs, or grows rapidly, please return to have it checked, as these can be an indication of a skin cancer.  Actinic keratoses are precancerous spots that appear secondary to cumulative UV radiation exposure/sun exposure over time. They are chronic with expected duration over 1 year. A portion of actinic keratoses will progress to squamous cell carcinoma of the skin. It is not possible to reliably predict which spots will progress to skin cancer and so treatment is recommended to prevent development of skin cancer.  Recommend daily broad spectrum sunscreen SPF 30+ to sun-exposed areas, reapply every 2 hours as needed.  Recommend staying in the shade or wearing long sleeves, sun glasses (UVA+UVB protection) and  wide brim hats (4-inch brim around the entire circumference of the hat). Call for new or changing lesions.   Cryotherapy Aftercare  Wash gently with soap and water everyday.   Apply Vaseline and Band-Aid daily until healed.    Melanoma ABCDEs  Melanoma is the most dangerous type of skin cancer, and is the leading cause of death from skin disease.  You are more likely to develop melanoma if you: Have light-colored skin, light-colored eyes, or red or blond hair Spend a lot of time in the sun Tan regularly, either outdoors or in a tanning bed Have had blistering sunburns, especially during childhood Have a close family member who has had a melanoma Have atypical moles or large birthmarks  Early detection of melanoma is key since treatment is typically straightforward and cure rates are extremely high if we catch it early.   The first sign of melanoma is often a change in a mole or a new dark spot.  The ABCDE system is a way of remembering the signs of melanoma.  A for asymmetry:  The two halves do not match. B for border:  The edges of the growth are irregular. C for color:  A mixture of colors are present instead of an even brown color. D for diameter:  Melanomas are usually (but not always) greater than 6mm - the size of a pencil eraser. E for evolution:  The spot keeps changing in size, shape, and color.  Please check your skin once per month between visits. You can  use a small mirror in front and a large mirror behind you to keep an eye on the back side or your body.   If you see any new or changing lesions before your next follow-up, please call to schedule a visit.  Please continue daily skin protection including broad spectrum sunscreen SPF 30+ to sun-exposed areas, reapplying every 2 hours as needed when you're outdoors.   Staying in the shade or wearing long sleeves, sun glasses (UVA+UVB protection) and wide brim hats (4-inch brim around the entire circumference of the hat)  are also recommended for sun protection.    Due to recent changes in healthcare laws, you may see results of your pathology and/or laboratory studies on MyChart before the doctors have had a chance to review them. We understand that in some cases there may be results that are confusing or concerning to you. Please understand that not all results are received at the same time and often the doctors may need to interpret multiple results in order to provide you with the best plan of care or course of treatment. Therefore, we ask that you please give [place] 2 business days to thoroughly review all your results before contacting the office for clarification. Should we see a critical lab result, you will be contacted sooner.   If You Need Anything After Your Visit  If you have any questions or concerns for your doctor, please call our main line at [phone] and press option 4 to reach your doctor's medical assistant. If no one answers, please leave a voicemail as directed and we will return your call as soon as possible. Messages left after 4 pm will be answered the following business day.   You may also send [place] a message via MyChart. We typically respond to MyChart messages within 1-2 business days.  For prescription refills, please ask your pharmacy to contact our office. Our fax number is [phone].  If you have an urgent issue when the clinic is closed that cannot wait until the next business day, you can page your doctor at the number below.    Please note that while we do our best to be available for urgent issues outside of office hours, we are not available 24/7.   If you have an urgent issue and are unable to reach [place], you may choose to seek medical care at your doctor's office, retail clinic, urgent care center, or emergency room.  If you have a medical emergency, please immediately call 911 or go to the emergency department.  Pager Numbers  - Dr. Gwen Pounds: [phone]  - Dr. Roseanne Reno:  [phone]  - Dr. Katrinka Blazing: [phone]   In the event of inclement weather, please call our main line at [phone] for an update on the status of any delays or closures.  Dermatology Medication Tips: Please keep the boxes that topical medications come in in order to help keep track of the instructions about where and how to use these. Pharmacies typically print the medication instructions only on the boxes and not directly on the medication tubes.   If your medication is too expensive, please contact our office at [phone] option 4 or send [place] a message through MyChart.   We are unable to tell what your co-pay for medications will be in advance as this is different depending on your insurance coverage. However, we may be able to find a substitute medication at lower cost or fill out paperwork to get insurance to cover a needed medication.  If a prior authorization is required to get your medication covered by your insurance company, please allow [place] 1-2 business days to complete this process.  Drug prices often vary depending on where the prescription is filled and some pharmacies may offer cheaper prices.  The website www.goodrx.com contains coupons for medications through different pharmacies. The prices here do not account for what the cost may be with help from insurance (it may be cheaper with your insurance), but the website can give you the price if you did not use any insurance.  - You can print the associated coupon and take it with your prescription to the pharmacy.  - You may also stop by our office during regular business hours and pick up a GoodRx coupon card.  - If you need your prescription sent electronically to a different pharmacy, notify our office through [facility] or by phone at [phone] option 4.     Si Usted Necesita Algo Despus de Su Visita  Tambin puede enviarnos un mensaje a travs de Clinical cytogeneticist. Por lo general respondemos a los mensajes de  MyChart en el transcurso de 1 a 2 das hbiles.  Para renovar recetas, por favor pida a su farmacia que se ponga en contacto con nuestra oficina. Annie Sable de fax es [place] [phone].  Si tiene un asunto urgente cuando la clnica est cerrada y que no puede esperar hasta el siguiente da hbil, puede llamar/localizar a su doctor(a) al nmero que aparece a continuacin.   Por favor, tenga en cuenta que aunque hacemos todo lo posible para estar disponibles para asuntos urgentes fuera del horario de [place], no estamos disponibles las 24 horas del da, los 7 [address]  [address] de la [place].   Si tiene un problema urgente y no puede comunicarse con nosotros, puede optar por buscar atencin mdica  en el consultorio de su doctor(a), en una clnica privada, en un centro de atencin urgente o en una sala de emergencias.  Si tiene Engineer, drilling, por favor llame inmediatamente al 911 o vaya a la sala de emergencias.  Nmeros de bper  - Dr. Gwen Pounds: [phone]  - Dra. Roseanne Reno: [phone]  - Dr. Katrinka Blazing: [phone]   En caso de inclemencias del tiempo, por favor llame a Lacy Duverney principal al [phone] para una actualizacin sobre el [place] de cualquier retraso o cierre.  Consejos para la medicacin en dermatologa: Por favor, guarde las cajas en las que vienen los medicamentos de uso tpico para ayudarle a seguir las instrucciones sobre dnde y cmo usarlos. Las farmacias generalmente imprimen las instrucciones del medicamento slo en las cajas y no directamente en los tubos del [place].   Si su medicamento es muy caro, por favor, pngase en contacto con Rolm Gala llamando al [phone] y presione la opcin 4 o envenos un mensaje a travs de Clinical cytogeneticist.   No podemos decirle cul ser su copago por los medicamentos por adelantado ya que esto es diferente dependiendo de la cobertura de su seguro. Sin embargo, es posible que podamos encontrar un medicamento sustituto a Advice worker un formulario para que el seguro cubra el medicamento que se considera necesario.   Si se requiere una autorizacin previa para que su compaa de seguros [place] su medicamento, por favor permtanos de 1 a 2 das hbiles para completar [address]  Los precios de los medicamentos varan con frecuencia dependiendo del Environmental consultant de dnde se surte la receta y alguna farmacias pueden ofrecer precios ms baratos.  El sitio web  www.goodrx.com tiene cupones para medicamentos de Health and safety inspector. Los precios aqu no tienen en cuenta lo que podra costar con la ayuda del seguro (puede ser ms barato con su seguro), pero el sitio web puede darle el precio si no utiliz Tourist information centre manager.  - Puede imprimir el cupn correspondiente y llevarlo con su receta a la farmacia.  - Tambin puede pasar por nuestra oficina durante el horario de atencin regular y Education officer, museum una tarjeta de cupones de GoodRx.  - Si necesita que su receta se enve electrnicamente a una farmacia diferente, informe a nuestra oficina a travs de MyChart de [place] o por telfono llamando al [phone] y presione la opcin 4.

## 2023-08-01 NOTE — Progress Notes (Signed)
Follow-Up Visit   Subjective  Donald Fowler is a 82 y.o. male who presents for the following: Skin Cancer Screening and Full Body Skin Exam Rough spots on arms  Wife  is with patient and contributes to history.   The patient presents for Total-Body Skin Exam (TBSE) for skin cancer screening and mole check. The patient has spots, moles and lesions to be evaluated, some may be new or changing and the patient may have concern these could be cancer.    The following portions of the chart were reviewed this encounter and updated as appropriate: medications, allergies, medical history  Review of Systems:  No other skin or systemic complaints except as noted in HPI or Assessment and Plan.  Objective  Well appearing patient in no apparent distress; mood and affect are within normal limits.  A full examination was performed including scalp, head, eyes, ears, nose, lips, neck, chest, axillae, abdomen, back, buttocks, bilateral upper extremities, bilateral lower extremities, hands, feet, fingers, toes, fingernails, and toenails. All findings within normal limits unless otherwise noted below.   Relevant physical exam findings are noted in the Assessment and Plan.  Right Ear x 1 Erythematous thin papules/macules with gritty scale.   right forearm x 1, left forearm x 1 (2) Erythematous stuck-on, waxy papule or plaque    Assessment & Plan   SKIN CANCER SCREENING PERFORMED TODAY.  ACTINIC DAMAGE - Chronic condition, secondary to cumulative UV/sun exposure - diffuse scaly erythematous macules with underlying dyspigmentation - Recommend daily broad spectrum sunscreen SPF 30+ to sun-exposed areas, reapply every 2 hours as needed.  - Staying in the shade or wearing long sleeves, sun glasses (UVA+UVB protection) and wide brim hats (4-inch brim around the entire circumference of the hat) are also recommended for sun protection.  - Call for new or changing lesions.  Purpura - Chronic;  persistent and recurrent.  Treatable, but not curable. 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 Condition is symptomatic/ bothersome to patient. Not currently at goal.   Cont Ketoconazole 2% cr qhs   LENTIGINES, SEBORRHEIC KERATOSES, HEMANGIOMAS - Benign normal skin lesions - Benign-appearing - Call for any changes  MELANOCYTIC NEVI - Tan-brown and/or pink-flesh-colored symmetric macules and papules - Benign appearing on exam today - Observation - Call clinic for new or changing moles - Recommend daily use of broad spectrum spf 30+ sunscreen to sun-exposed areas.   HISTORY OF BASAL CELL CARCINOMA OF THE SKIN  Multiple areas see history  - No evidence of recurrence today - Recommend regular full body skin exams - Recommend daily broad spectrum sunscreen SPF 30+ to sun-exposed areas, reapply every 2 hours as needed.  - Call if any new or changing lesions are noted between office visits   Actinic keratosis Right Ear x 1  Actinic keratoses are precancerous spots that appear secondary to cumulative UV radiation exposure/sun exposure over time. They are chronic with expected duration over 1 year. A portion of actinic keratoses will progress to squamous cell carcinoma of the skin. It is not possible to reliably predict which spots will progress to skin cancer and so treatment is recommended to prevent  development of skin cancer.  Recommend daily broad spectrum sunscreen SPF 30+ to sun-exposed areas, reapply every 2 hours as needed.  Recommend staying in the shade or wearing long sleeves, sun glasses (UVA+UVB protection) and  wide brim hats (4-inch brim around the entire circumference of the hat). Call for new or changing lesions.  Destruction of lesion - Right Ear x 1 Complexity: simple   Destruction method: cryotherapy   Informed consent: discussed and consent obtained   Timeout:  patient name, date of birth, surgical site, and procedure verified Lesion destroyed using liquid nitrogen: Yes   Region frozen until ice ball extended beyond lesion: Yes   Outcome: patient tolerated procedure well with no complications   Post-procedure details: wound care instructions given    Inflamed seborrheic keratosis (2) right forearm x 1, left forearm x 1  Symptomatic, irritating, patient would like treated.  Destruction of lesion - right forearm x 1, left forearm x 1 (2) Complexity: simple   Destruction method: cryotherapy   Informed consent: discussed and consent obtained   Timeout:  patient name, date of birth, surgical site, and procedure verified Lesion destroyed using liquid nitrogen: Yes   Region frozen until ice ball extended beyond lesion: Yes   Outcome: patient tolerated procedure well with no complications   Post-procedure details: wound care instructions given    Tinea pedis of both feet  Related Medications ketoconazole (NIZORAL) 2 % cream Apply 1 Application topically daily. Qhs to feet     Return in about 1 year (around 07/31/2024) for TBSE.  IAsher Muir, CMA, am acting as scribe for Armida Sans, MD.   Documentation: I have reviewed the above documentation for accuracy and completeness, and I agree with the above.  Armida Sans, MD

## 2024-08-06 ENCOUNTER — Encounter: Payer: Self-pay | Admitting: Dermatology

## 2024-08-06 ENCOUNTER — Ambulatory Visit: Payer: Medicare PPO | Admitting: Dermatology

## 2024-08-06 DIAGNOSIS — L814 Other melanin hyperpigmentation: Secondary | ICD-10-CM | POA: Diagnosis not present

## 2024-08-06 DIAGNOSIS — W908XXA Exposure to other nonionizing radiation, initial encounter: Secondary | ICD-10-CM | POA: Diagnosis not present

## 2024-08-06 DIAGNOSIS — Z1283 Encounter for screening for malignant neoplasm of skin: Secondary | ICD-10-CM

## 2024-08-06 DIAGNOSIS — Z85828 Personal history of other malignant neoplasm of skin: Secondary | ICD-10-CM

## 2024-08-06 DIAGNOSIS — L821 Other seborrheic keratosis: Secondary | ICD-10-CM | POA: Diagnosis not present

## 2024-08-06 DIAGNOSIS — I781 Nevus, non-neoplastic: Secondary | ICD-10-CM

## 2024-08-06 DIAGNOSIS — L820 Inflamed seborrheic keratosis: Secondary | ICD-10-CM | POA: Diagnosis not present

## 2024-08-06 DIAGNOSIS — L578 Other skin changes due to chronic exposure to nonionizing radiation: Secondary | ICD-10-CM

## 2024-08-06 DIAGNOSIS — D692 Other nonthrombocytopenic purpura: Secondary | ICD-10-CM

## 2024-08-06 DIAGNOSIS — R238 Other skin changes: Secondary | ICD-10-CM

## 2024-08-06 DIAGNOSIS — D1801 Hemangioma of skin and subcutaneous tissue: Secondary | ICD-10-CM

## 2024-08-06 DIAGNOSIS — D229 Melanocytic nevi, unspecified: Secondary | ICD-10-CM

## 2024-08-06 NOTE — Progress Notes (Signed)
   Follow-Up Visit   Subjective  Donald Fowler is a 83 y.o. male who presents for the following: Skin Cancer Screening and Upper Body Skin Exam, hx of BCC  The patient presents for Upper Body Skin Exam (UBSE) for skin cancer screening and mole check. The patient has spots, moles and lesions to be evaluated, some may be new or changing and the patient may have concern these could be cancer.  Wife is with patient and contributes to history.   The following portions of the chart were reviewed this encounter and updated as appropriate: medications, allergies, medical history  Review of Systems:  No other skin or systemic complaints except as noted in HPI or Assessment and Plan.  Objective  Well appearing patient in no apparent distress; mood and affect are within normal limits.  All skin waist up examined. Relevant physical exam findings are noted in the Assessment and Plan.  right side burn/temple x 4 Stuck-on, waxy, tan-brown papules and plaques -- Discussed benign etiology and prognosis.   Assessment & Plan   INFLAMED SEBORRHEIC KERATOSIS right side burn/temple x 4 Symptomatic, irritating, patient would like treated.  Destruction of lesion - right side burn/temple x 4 Complexity: simple   Destruction method: cryotherapy   Informed consent: discussed and consent obtained   Timeout:  patient name, date of birth, surgical site, and procedure verified Lesion destroyed using liquid nitrogen: Yes   Region frozen until ice ball extended beyond lesion: Yes   Outcome: patient tolerated procedure well with no complications   Post-procedure details: wound care instructions given     Skin cancer screening performed today.  Actinic Damage - Chronic condition, secondary to cumulative UV/sun exposure - diffuse scaly erythematous macules with underlying dyspigmentation - Recommend daily broad spectrum sunscreen SPF 30+ to sun-exposed areas, reapply every 2 hours as needed.  - Staying in the  shade or wearing long sleeves, sun glasses (UVA+UVB protection) and wide brim hats (4-inch brim around the entire circumference of the hat) are also recommended for sun protection.  - Call for new or changing lesions.  Lentigines, Seborrheic Keratoses, Hemangiomas - Benign normal skin lesions - Benign-appearing - Call for any changes  Melanocytic Nevi - Tan-brown and/or pink-flesh-colored symmetric macules and papules - Benign appearing on exam today - Observation - Call clinic for new or changing moles - Recommend daily use of broad spectrum spf 30+ sunscreen to sun-exposed areas.   [place] Left chest  Observe   Purpura - Chronic; persistent and recurrent.  Treatable, but not curable. - Violaceous macules and patches - Benign - Related to trauma, age, sun damage and/or use of blood thinners, chronic use of topical and/or oral steroids - Observe - Can use OTC arnica containing moisturizer such as Dermend Bruise Formula if desired - Call for worsening or other concerns   HISTORY OF BASAL CELL CARCINOMA OF THE SKIN - No evidence of recurrence today - Recommend regular full body skin exams - Recommend daily broad spectrum sunscreen SPF 30+ to sun-exposed areas, reapply every 2 hours as needed.  - Call if any new or changing lesions are noted between office visits   Return in about 1 year (around 08/06/2025) for TBSE, hx of BCC.  IFay Kirks, CMA, am acting as scribe for Alm Rhyme, MD .   Documentation: I have reviewed the above documentation for accuracy and completeness, and I agree with the above.  Alm Rhyme, MD

## 2024-08-06 NOTE — Patient Instructions (Signed)

## 2025-08-12 ENCOUNTER — Ambulatory Visit: Admitting: Dermatology
